# Patient Record
Sex: Male | Born: 1960 | Race: White | Hispanic: No | Marital: Single | Smoking: Never smoker
Health system: Southern US, Community
[De-identification: ages and names within clinical notes are randomized; demographics above are authoritative.]

## PROBLEM LIST (undated history)

## (undated) DIAGNOSIS — E559 Vitamin D deficiency, unspecified: Secondary | ICD-10-CM

## (undated) DIAGNOSIS — E109 Type 1 diabetes mellitus without complications: Secondary | ICD-10-CM

## (undated) DIAGNOSIS — R569 Unspecified convulsions: Secondary | ICD-10-CM

## (undated) DIAGNOSIS — E039 Hypothyroidism, unspecified: Secondary | ICD-10-CM

## (undated) DIAGNOSIS — S32009A Unspecified fracture of unspecified lumbar vertebra, initial encounter for closed fracture: Secondary | ICD-10-CM

## (undated) DIAGNOSIS — A3211 Listerial meningitis: Secondary | ICD-10-CM

## (undated) HISTORY — PX: NO PAST SURGERIES: SHX2092

---

## 2010-04-18 ENCOUNTER — Inpatient Hospital Stay: Payer: Self-pay | Admitting: Endocrinology

## 2010-10-24 ENCOUNTER — Inpatient Hospital Stay: Payer: Self-pay | Admitting: Endocrinology

## 2014-09-13 DIAGNOSIS — E1065 Type 1 diabetes mellitus with hyperglycemia: Secondary | ICD-10-CM | POA: Insufficient documentation

## 2014-09-13 DIAGNOSIS — E538 Deficiency of other specified B group vitamins: Secondary | ICD-10-CM | POA: Insufficient documentation

## 2017-06-28 ENCOUNTER — Emergency Department: Payer: 59

## 2017-06-28 ENCOUNTER — Inpatient Hospital Stay
Admission: EM | Admit: 2017-06-28 | Discharge: 2017-07-04 | DRG: 543 | Disposition: A | Discharge status: Skilled Nursing Facility | Payer: 59 | Attending: Internal Medicine | Admitting: Internal Medicine

## 2017-06-28 ENCOUNTER — Encounter: Payer: Self-pay | Admitting: Emergency Medicine

## 2017-06-28 DIAGNOSIS — S32040A Wedge compression fracture of fourth lumbar vertebra, initial encounter for closed fracture: Secondary | ICD-10-CM

## 2017-06-28 DIAGNOSIS — S32049A Unspecified fracture of fourth lumbar vertebra, initial encounter for closed fracture: Secondary | ICD-10-CM

## 2017-06-28 DIAGNOSIS — Z794 Long term (current) use of insulin: Secondary | ICD-10-CM

## 2017-06-28 DIAGNOSIS — E10649 Type 1 diabetes mellitus with hypoglycemia without coma: Secondary | ICD-10-CM | POA: Diagnosis not present

## 2017-06-28 DIAGNOSIS — M810 Age-related osteoporosis without current pathological fracture: Secondary | ICD-10-CM | POA: Diagnosis present

## 2017-06-28 DIAGNOSIS — M4856XA Collapsed vertebra, not elsewhere classified, lumbar region, initial encounter for fracture: Secondary | ICD-10-CM | POA: Diagnosis not present

## 2017-06-28 DIAGNOSIS — R55 Syncope and collapse: Secondary | ICD-10-CM

## 2017-06-28 DIAGNOSIS — R001 Bradycardia, unspecified: Secondary | ICD-10-CM | POA: Diagnosis present

## 2017-06-28 DIAGNOSIS — M545 Low back pain: Secondary | ICD-10-CM | POA: Diagnosis present

## 2017-06-28 DIAGNOSIS — E109 Type 1 diabetes mellitus without complications: Secondary | ICD-10-CM

## 2017-06-28 DIAGNOSIS — R569 Unspecified convulsions: Secondary | ICD-10-CM | POA: Diagnosis present

## 2017-06-28 DIAGNOSIS — S32041A Stable burst fracture of fourth lumbar vertebra, initial encounter for closed fracture: Secondary | ICD-10-CM | POA: Diagnosis present

## 2017-06-28 DIAGNOSIS — E039 Hypothyroidism, unspecified: Secondary | ICD-10-CM | POA: Diagnosis present

## 2017-06-28 DIAGNOSIS — E1065 Type 1 diabetes mellitus with hyperglycemia: Secondary | ICD-10-CM | POA: Diagnosis present

## 2017-06-28 DIAGNOSIS — R471 Dysarthria and anarthria: Secondary | ICD-10-CM | POA: Diagnosis present

## 2017-06-28 DIAGNOSIS — Y92410 Unspecified street and highway as the place of occurrence of the external cause: Secondary | ICD-10-CM

## 2017-06-28 DIAGNOSIS — K567 Ileus, unspecified: Secondary | ICD-10-CM

## 2017-06-28 DIAGNOSIS — R000 Tachycardia, unspecified: Secondary | ICD-10-CM | POA: Diagnosis present

## 2017-06-28 DIAGNOSIS — R739 Hyperglycemia, unspecified: Secondary | ICD-10-CM

## 2017-06-28 HISTORY — DX: Hypothyroidism, unspecified: E03.9

## 2017-06-28 HISTORY — DX: Type 1 diabetes mellitus without complications: E10.9

## 2017-06-28 LAB — TROPONIN I
Troponin I: <0.03 ng/mL (ref <0.03)
Troponin I: <0.03 ng/mL (ref <0.03)

## 2017-06-28 LAB — COMPREHENSIVE METABOLIC PANEL
ALK PHOS: 141 U/L — AB (ref 38–126)
ALT: 34 U/L (ref 17–63)
ANION GAP: 15 (ref 5–15)
AST: 49 U/L — ABNORMAL HIGH (ref 15–41)
Albumin: 3.4 g/dL — ABNORMAL LOW (ref 3.5–5.0)
BUN: 12 mg/dL (ref 6–20)
CALCIUM: 8.6 mg/dL — AB (ref 8.9–10.3)
CHLORIDE: 103 mmol/L (ref 101–111)
CO2: 18 mmol/L — AB (ref 22–32)
CREATININE: 0.81 mg/dL (ref 0.61–1.24)
Glucose, Bld: 441 mg/dL — ABNORMAL HIGH (ref 65–99)
Potassium: 4 mmol/L (ref 3.5–5.1)
SODIUM: 136 mmol/L (ref 135–145)
Total Bilirubin: 0.3 mg/dL (ref 0.3–1.2)
Total Protein: 7 g/dL (ref 6.5–8.1)

## 2017-06-28 LAB — URINALYSIS, ROUTINE W REFLEX MICROSCOPIC
Bacteria, UA: NONE SEEN
Bilirubin Urine: NEGATIVE
Glucose, UA: >=500 mg/dL — AB
Hgb urine dipstick: NEGATIVE
Ketones, ur: 5 mg/dL — AB
Leukocytes, UA: NEGATIVE
Nitrite: NEGATIVE
PROTEIN: NEGATIVE mg/dL
SPECIFIC GRAVITY, URINE: 1.038 — AB (ref 1.005–1.030)
SQUAMOUS EPITHELIAL / LPF: NONE SEEN
pH: 6 (ref 5.0–8.0)

## 2017-06-28 LAB — GLUCOSE, CAPILLARY
GLUCOSE-CAPILLARY: 390 mg/dL — AB (ref 65–99)
GLUCOSE-CAPILLARY: 403 mg/dL — AB (ref 65–99)
GLUCOSE-CAPILLARY: 138 mg/dL — AB (ref 65–99)

## 2017-06-28 LAB — CBC WITH DIFFERENTIAL/PLATELET
Basophils Absolute: 0.1 10*3/uL (ref 0–0.1)
Basophils Relative: 1 %
EOS ABS: 0.1 10*3/uL (ref 0–0.7)
EOS PCT: 1 %
HCT: 35.6 % — ABNORMAL LOW (ref 40.0–52.0)
Hemoglobin: 11.3 g/dL — ABNORMAL LOW (ref 13.0–18.0)
LYMPHS ABS: 2.4 10*3/uL (ref 1.0–3.6)
LYMPHS PCT: 20 %
MCH: 24.7 pg — AB (ref 26.0–34.0)
MCHC: 31.7 g/dL — AB (ref 32.0–36.0)
MCV: 77.9 fL — AB (ref 80.0–100.0)
MONO ABS: 0.7 10*3/uL (ref 0.2–1.0)
MONOS PCT: 6 %
Neutro Abs: 9.1 10*3/uL — ABNORMAL HIGH (ref 1.4–6.5)
Neutrophils Relative %: 72 %
PLATELETS: 415 10*3/uL (ref 150–440)
RBC: 4.56 MIL/uL (ref 4.40–5.90)
RDW: 18.6 % — ABNORMAL HIGH (ref 11.5–14.5)
WBC: 12.5 10*3/uL — ABNORMAL HIGH (ref 3.8–10.6)

## 2017-06-28 LAB — URINE DRUG SCREEN, QUALITATIVE (ARMC ONLY)
Amphetamines, Ur Screen: NOT DETECTED
BARBITURATES, UR SCREEN: NOT DETECTED
Benzodiazepine, Ur Scrn: NOT DETECTED
CANNABINOID 50 NG, UR ~~LOC~~: NOT DETECTED
COCAINE METABOLITE, UR ~~LOC~~: NOT DETECTED
MDMA (Ecstasy)Ur Screen: NOT DETECTED
Methadone Scn, Ur: NOT DETECTED
Opiate, Ur Screen: POSITIVE — AB
Phencyclidine (PCP) Ur S: NOT DETECTED
TRICYCLIC, UR SCREEN: NOT DETECTED

## 2017-06-28 LAB — BASIC METABOLIC PANEL
ANION GAP: 8 (ref 5–15)
BUN: 10 mg/dL (ref 6–20)
CALCIUM: 8.1 mg/dL — AB (ref 8.9–10.3)
CO2: 25 mmol/L (ref 22–32)
Chloride: 106 mmol/L (ref 101–111)
Creatinine, Ser: 0.71 mg/dL (ref 0.61–1.24)
GLUCOSE: 265 mg/dL — AB (ref 65–99)
Potassium: 3.8 mmol/L (ref 3.5–5.1)
Sodium: 139 mmol/L (ref 135–145)

## 2017-06-28 LAB — PROTIME-INR
INR: 0.97
Prothrombin Time: 12.9 seconds (ref 11.4–15.2)

## 2017-06-28 LAB — TYPE AND SCREEN
ABO/RH(D): O POS
Antibody Screen: NEGATIVE

## 2017-06-28 LAB — ETHANOL: Alcohol, Ethyl (B): <5 mg/dL (ref <5)

## 2017-06-28 LAB — APTT: aPTT: 27 seconds (ref 24–36)

## 2017-06-28 LAB — LIPASE, BLOOD: LIPASE: 26 U/L (ref 11–51)

## 2017-06-28 MED ORDER — KETOROLAC TROMETHAMINE 15 MG/ML IJ SOLN
15.0000 mg | Freq: Four times a day (QID) | INTRAMUSCULAR | Status: AC | PRN
  Administered 2017-06-28 – 2017-07-03 (×15): 15 mg via INTRAVENOUS
  Filled 2017-06-28 (×15): qty 1

## 2017-06-28 MED ORDER — ONDANSETRON HCL 4 MG/2ML IJ SOLN
4.0000 mg | Freq: Once | INTRAMUSCULAR | Status: AC
  Administered 2017-06-28: 4 mg via INTRAVENOUS
  Filled 2017-06-28: qty 2

## 2017-06-28 MED ORDER — MORPHINE SULFATE (PF) 2 MG/ML IV SOLN
2.0000 mg | Freq: Once | INTRAVENOUS | Status: AC
  Administered 2017-06-28: 2 mg via INTRAVENOUS
  Filled 2017-06-28: qty 1

## 2017-06-28 MED ORDER — SODIUM CHLORIDE 0.9 % IV SOLN
INTRAVENOUS | Status: AC
  Administered 2017-06-28 – 2017-06-29 (×2): via INTRAVENOUS

## 2017-06-28 MED ORDER — TRAMADOL HCL 50 MG PO TABS
50.0000 mg | ORAL_TABLET | Freq: Four times a day (QID) | ORAL | Status: DC | PRN
Start: 2017-06-28 — End: 2017-07-02
  Administered 2017-06-28 – 2017-07-01 (×8): 50 mg via ORAL
  Filled 2017-06-28 (×8): qty 1

## 2017-06-28 MED ORDER — STROKE: EARLY STAGES OF RECOVERY BOOK: Freq: Once | Status: DC

## 2017-06-28 MED ORDER — ENOXAPARIN SODIUM 40 MG/0.4ML ~~LOC~~ SOLN: 40.0000 mg | SUBCUTANEOUS | Status: DC

## 2017-06-28 MED ORDER — INSULIN ASPART 100 UNIT/ML ~~LOC~~ SOLN
0.0000 [IU] | Freq: Every day | SUBCUTANEOUS | Status: DC
  Administered 2017-06-29 – 2017-07-03 (×4): 2 [IU] via SUBCUTANEOUS
  Filled 2017-06-28 (×4): qty 1

## 2017-06-28 MED ORDER — ACETAMINOPHEN 325 MG PO TABS: 650.0000 mg | ORAL_TABLET | Freq: Four times a day (QID) | ORAL | Status: DC | PRN

## 2017-06-28 MED ORDER — ACETAMINOPHEN 650 MG RE SUPP: 650.0000 mg | Freq: Four times a day (QID) | RECTAL | Status: DC | PRN

## 2017-06-28 MED ORDER — INSULIN ASPART 100 UNIT/ML ~~LOC~~ SOLN
10.0000 [IU] | Freq: Once | SUBCUTANEOUS | Status: AC
  Administered 2017-06-28: 10 [IU] via SUBCUTANEOUS
  Filled 2017-06-28: qty 1

## 2017-06-28 MED ORDER — INSULIN ASPART 100 UNIT/ML ~~LOC~~ SOLN
0.0000 [IU] | Freq: Three times a day (TID) | SUBCUTANEOUS | Status: DC
  Administered 2017-06-29 (×2): 5 [IU] via SUBCUTANEOUS
  Administered 2017-06-29 – 2017-07-01 (×2): 2 [IU] via SUBCUTANEOUS
  Administered 2017-07-01: 1 [IU] via SUBCUTANEOUS
  Administered 2017-07-02: 3 [IU] via SUBCUTANEOUS
  Administered 2017-07-02 (×2): 2 [IU] via SUBCUTANEOUS
  Administered 2017-07-03 (×2): 3 [IU] via SUBCUTANEOUS
  Administered 2017-07-03: 2 [IU] via SUBCUTANEOUS
  Administered 2017-07-04: 3 [IU] via SUBCUTANEOUS
  Filled 2017-06-28 (×12): qty 1

## 2017-06-28 MED ORDER — IOPAMIDOL (ISOVUE-370) INJECTION 76%
100.0000 mL | Freq: Once | INTRAVENOUS | Status: AC | PRN
  Administered 2017-06-28: 100 mL via INTRAVENOUS

## 2017-06-28 MED ORDER — ENOXAPARIN SODIUM 30 MG/0.3ML ~~LOC~~ SOLN
30.0000 mg | SUBCUTANEOUS | Status: DC
Start: 2017-06-29 — End: 2017-06-29
  Administered 2017-06-29: 30 mg via SUBCUTANEOUS
  Filled 2017-06-28: qty 0.3

## 2017-06-28 MED ORDER — HYDROMORPHONE HCL 1 MG/ML IJ SOLN
0.5000 mg | Freq: Once | INTRAMUSCULAR | Status: AC
  Administered 2017-06-28: 0.5 mg via INTRAVENOUS
  Filled 2017-06-28: qty 1

## 2017-06-28 MED ORDER — SODIUM CHLORIDE 0.9 % IV BOLUS (SEPSIS)
1000.0000 mL | Freq: Once | INTRAVENOUS | Status: AC
  Administered 2017-06-28: 1000 mL via INTRAVENOUS

## 2017-06-28 MED ORDER — ASPIRIN EC 81 MG PO TBEC
81.0000 mg | DELAYED_RELEASE_TABLET | Freq: Every day | ORAL | Status: DC
  Administered 2017-06-28: 81 mg via ORAL
  Filled 2017-06-28 (×2): qty 1

## 2017-06-28 MED ORDER — INSULIN ASPART 100 UNIT/ML ~~LOC~~ SOLN
5.0000 [IU] | Freq: Once | SUBCUTANEOUS | Status: AC
  Administered 2017-06-28: 5 [IU] via INTRAVENOUS
  Filled 2017-06-28: qty 1

## 2017-06-28 MED ORDER — MORPHINE SULFATE (PF) 4 MG/ML IV SOLN
4.0000 mg | Freq: Once | INTRAVENOUS | Status: AC
  Administered 2017-06-28: 4 mg via INTRAVENOUS
  Filled 2017-06-28: qty 1

## 2017-06-28 NOTE — Progress Notes (Signed)
ADMISSION NOTE:  Pt admitted to room 150 from ED. Pt alert and oriented, Skin assessment completed, sacral foam and tele monitor applied. No personal belongings at bedside. Pt oriented to room and call bell. Family at bedside. Bed in lowest position call bell in reach and bed alarm on.

## 2017-06-28 NOTE — ED Notes (Signed)
Discussed pts amount slurred speech with dr Clearnce Hasten. Called CT for head scan and will do angio once labs back.

## 2017-06-28 NOTE — ED Notes (Signed)
Pt remains to c/o pain to back. Dr Clearnce Hasten notified.

## 2017-06-28 NOTE — Progress Notes (Signed)
Pts BG 403, MD Gouru notified orders received to give one time dose Novolog 10 units.
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
## 2017-06-28 NOTE — ED Notes (Signed)
Pt now staying at [facility], will not be transferred.  Family updated by  Dr Diannia Ruder
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
## 2017-06-28 NOTE — ED Notes (Signed)
Patient transported to CT 

## 2017-06-28 NOTE — ED Notes (Signed)
c collar removed by dr Stark Klein
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
## 2017-06-28 NOTE — ED Notes (Signed)
Returned from CT, neuro consult on screen for exam
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
## 2017-06-28 NOTE — Progress Notes (Signed)
Lumbar corset delivered and applied on pt.
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
## 2017-06-29 ENCOUNTER — Observation Stay: Payer: 59

## 2017-06-29 ENCOUNTER — Observation Stay (HOSPITAL_BASED_OUTPATIENT_CLINIC_OR_DEPARTMENT_OTHER)
Admit: 2017-06-29 | Discharge: 2017-06-29 | Disposition: A | Discharge status: Home / Self Care | Payer: 59 | Attending: Internal Medicine | Admitting: Internal Medicine

## 2017-06-29 DIAGNOSIS — R55 Syncope and collapse: Secondary | ICD-10-CM

## 2017-06-29 LAB — CBC
HCT: 29.6 % — ABNORMAL LOW (ref 40.0–52.0)
Hemoglobin: 9.6 g/dL — ABNORMAL LOW (ref 13.0–18.0)
MCH: 24.9 pg — ABNORMAL LOW (ref 26.0–34.0)
MCHC: 32.5 g/dL (ref 32.0–36.0)
MCV: 76.7 fL — AB (ref 80.0–100.0)
PLATELETS: 334 10*3/uL (ref 150–440)
RBC: 3.86 MIL/uL — AB (ref 4.40–5.90)
RDW: 18.1 % — AB (ref 11.5–14.5)
WBC: 12 10*3/uL — AB (ref 3.8–10.6)

## 2017-06-29 LAB — TROPONIN I: Troponin I: <0.03 ng/mL (ref <0.03)

## 2017-06-29 LAB — COMPREHENSIVE METABOLIC PANEL
ALT: 24 U/L (ref 17–63)
AST: 26 U/L (ref 15–41)
Albumin: 2.8 g/dL — ABNORMAL LOW (ref 3.5–5.0)
Alkaline Phosphatase: 98 U/L (ref 38–126)
Anion gap: 7 (ref 5–15)
BILIRUBIN TOTAL: 0.5 mg/dL (ref 0.3–1.2)
BUN: 10 mg/dL (ref 6–20)
CALCIUM: 7.7 mg/dL — AB (ref 8.9–10.3)
CHLORIDE: 106 mmol/L (ref 101–111)
CO2: 23 mmol/L (ref 22–32)
CREATININE: 0.57 mg/dL — AB (ref 0.61–1.24)
Glucose, Bld: 216 mg/dL — ABNORMAL HIGH (ref 65–99)
Potassium: 3.6 mmol/L (ref 3.5–5.1)
Sodium: 136 mmol/L (ref 135–145)
TOTAL PROTEIN: 5.6 g/dL — AB (ref 6.5–8.1)

## 2017-06-29 LAB — LIPID PANEL
CHOLESTEROL: 125 mg/dL (ref 0–200)
HDL: 32 mg/dL — AB (ref >40)
LDL Cholesterol: 71 mg/dL (ref 0–99)
TRIGLYCERIDES: 109 mg/dL (ref <150)
Total CHOL/HDL Ratio: 3.9 RATIO
VLDL: 22 mg/dL (ref 0–40)

## 2017-06-29 LAB — GLUCOSE, CAPILLARY
GLUCOSE-CAPILLARY: 290 mg/dL — AB (ref 65–99)
GLUCOSE-CAPILLARY: 220 mg/dL — AB (ref 65–99)
Glucose-Capillary: 281 mg/dL — ABNORMAL HIGH (ref 65–99)
Glucose-Capillary: 216 mg/dL — ABNORMAL HIGH (ref 65–99)
Glucose-Capillary: 180 mg/dL — ABNORMAL HIGH (ref 65–99)
Glucose-Capillary: 177 mg/dL — ABNORMAL HIGH (ref 65–99)

## 2017-06-29 LAB — ECHOCARDIOGRAM COMPLETE
Height: 68 in
WEIGHTICAEL: 2080 [oz_av]

## 2017-06-29 LAB — HIV ANTIBODY (ROUTINE TESTING W REFLEX): HIV Screen 4th Generation wRfx: NONREACTIVE

## 2017-06-29 LAB — TSH: TSH: 3.037 u[IU]/mL (ref 0.350–4.500)

## 2017-06-29 MED ORDER — LEVOTHYROXINE SODIUM 25 MCG PO TABS
125.0000 ug | ORAL_TABLET | Freq: Every day | ORAL | Status: DC
  Administered 2017-07-01 – 2017-07-04 (×4): 125 ug via ORAL
  Filled 2017-06-29 (×4): qty 1

## 2017-06-29 MED ORDER — SODIUM CHLORIDE 0.9 % IV SOLN
INTRAVENOUS | Status: DC
  Administered 2017-06-29: 13:00:00 via INTRAVENOUS
  Administered 2017-06-30: 100 mL/h via INTRAVENOUS
  Administered 2017-06-30 – 2017-07-03 (×6): via INTRAVENOUS

## 2017-06-29 MED ORDER — MORPHINE SULFATE (PF) 2 MG/ML IV SOLN
2.0000 mg | INTRAVENOUS | Status: DC | PRN
  Administered 2017-06-29 – 2017-07-01 (×4): 2 mg via INTRAVENOUS
  Filled 2017-06-29 (×4): qty 1

## 2017-06-29 MED ORDER — LIDOCAINE 5 % EX PTCH
1.0000 | MEDICATED_PATCH | CUTANEOUS | Status: DC
  Administered 2017-06-29 – 2017-07-04 (×6): 1 via TRANSDERMAL
  Filled 2017-06-29 (×6): qty 1

## 2017-06-29 MED ORDER — SODIUM CHLORIDE 0.9 % IV BOLUS (SEPSIS)
250.0000 mL | Freq: Once | INTRAVENOUS | Status: AC
  Administered 2017-06-29: 250 mL via INTRAVENOUS

## 2017-06-29 MED ORDER — INSULIN DETEMIR 100 UNIT/ML ~~LOC~~ SOLN
12.0000 [IU] | Freq: Two times a day (BID) | SUBCUTANEOUS | Status: DC
  Administered 2017-06-29 (×2): 12 [IU] via SUBCUTANEOUS
  Filled 2017-06-29 (×4): qty 0.12

## 2017-06-29 MED ORDER — ONDANSETRON HCL 4 MG/2ML IJ SOLN
4.0000 mg | Freq: Four times a day (QID) | INTRAMUSCULAR | Status: DC | PRN
  Administered 2017-06-29 – 2017-07-03 (×3): 4 mg via INTRAVENOUS
  Filled 2017-06-29 (×3): qty 2
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
## 2017-06-29 NOTE — Progress Notes (Signed)
Notified Dr Tressia Miners that upon working with PT, patients HR dropped to 39 on tele monitor. Patient became diaphoretic, dizzy and confused. Per Dr Tressia Miners, will begin NS at 163ml/hr and continue to monitor.

## 2017-06-29 NOTE — Progress Notes (Signed)
Pt complaining of nausea. Dr. Jannifer Franklin notified. MD to place order.

## 2017-06-29 NOTE — Progress Notes (Signed)
SLP Cancellation Note  Patient Details Name: Carlos Mueller MRN: [id number] DOB: 05/13/1961   Cancelled treatment:       Reason Eval/Treat Not Completed: SLP screened, no needs identified, will sign off (chart reviewed; MD/NSG consulted) MD indicated pt is at his baseline w/ cognitive-linguistic issues; NSG has only noted min confusion intermittently when pt's blood pressure lowers, orthostatic; otherwise wfl. Pt verbally communicates w/ NSG/MD appropriately per MD. Noted head CT negative.  ST services will sign off at this time per MD; MD to reconsult if change in status.    Orinda Kenner, MS, CCC-SLP Watson,Katherine 06/29/2017, 3:42 PM

## 2017-06-29 NOTE — NC FL2 (Signed)
[place] LEVEL OF CARE SCREENING TOOL     IDENTIFICATION  Patient Name: Carlos Mueller Birthdate: 10-21-1961 Sex: male Admission Date (Current Location): 06/28/2017  [place] and [place] Number:  Engineering geologist and Address:  [facility], [address], [place], [place] [address]      Provider Number: [id number]  Attending Physician Name and Address:  Gladstone Lighter, MD  Relative Name and Phone Number:       Current Level of Care: Hospital Recommended Level of Care: [place] Prior Approval Number:    Date Approved/Denied: 06/29/17 PASRR Number: [id number]  Discharge Plan: SNF    Current Diagnoses: Patient Active Problem List   Diagnosis Date Noted  . Syncope 06/28/2017    Orientation RESPIRATION BLADDER Height & Weight     Self, Time, Situation, Place  Normal Continent Weight: 130 lb (59 kg) Height:  5\' 8"  (172.7 cm)  BEHAVIORAL SYMPTOMS/MOOD NEUROLOGICAL BOWEL NUTRITION STATUS      Continent Diet (Carb modified )  AMBULATORY STATUS COMMUNICATION OF NEEDS Skin   Extensive Assist Verbally Normal                       Personal Care Assistance Level of Assistance  Bathing, Feeding, Dressing Bathing Assistance: Limited assistance Feeding assistance: Independent Dressing Assistance: Limited assistance     Functional Limitations Info             SPECIAL CARE FACTORS FREQUENCY  PT (By licensed PT)     PT Frequency: Up to 5X per day, 5 days per week              Contractures Contractures Info: Not present    Additional Factors Info  Code Status, Allergies, Insulin Sliding Scale Code Status Info: Full Allergies Info: NKA   Insulin Sliding Scale Info: Levemir: Inject 0.12 mLs (12 Units total) into the skin 2 (two) times daily. - Subcutaneous       Current Medications (06/29/2017):  This is the current hospital active medication list Current Facility-Administered Medications   Medication Dose Route Frequency Provider Last Rate Last Dose  .  stroke: mapping our early stages of recovery book   Does not apply Once Gouru, Aruna, MD      . 0.9 %  sodium chloride infusion   Intravenous Continuous Gladstone Lighter, MD 100 mL/hr at 06/29/17 1240    . acetaminophen (TYLENOL) tablet 650 mg  650 mg Oral Q6H PRN Gouru, Aruna, MD       Or  . acetaminophen (TYLENOL) suppository 650 mg  650 mg Rectal Q6H PRN Gouru, Aruna, MD      . insulin aspart (novoLOG) injection 0-5 Units  0-5 Units Subcutaneous QHS Gouru, Aruna, MD      . insulin aspart (novoLOG) injection 0-9 Units  0-9 Units Subcutaneous TID WC Gouru, Aruna, MD   2 Units at 06/29/17 1200  . insulin detemir (LEVEMIR) injection 12 Units  12 Units Subcutaneous BID Gladstone Lighter, MD   12 Units at 06/29/17 1527  . ketorolac (TORADOL) 15 MG/ML injection 15 mg  15 mg Intravenous Q6H PRN Gouru, Aruna, MD   15 mg at 06/29/17 0305  . [START ON 06/30/2017] levothyroxine (SYNTHROID, LEVOTHROID) tablet 125 mcg  125 mcg Oral QAC breakfast Gladstone Lighter, MD      . lidocaine (LIDODERM) 5 % 1 patch  1 patch Transdermal Q24H Gladstone Lighter, MD   1 patch at 06/29/17 1113  . morphine 2  MG/ML injection 2 mg  2 mg Intravenous Q4H PRN Gladstone Lighter, MD   2 mg at 06/29/17 1112  . ondansetron (ZOFRAN) injection 4 mg  4 mg Intravenous Q6H PRN Lance Coon, MD   4 mg at 06/29/17 0226  . traMADol (ULTRAM) tablet 50 mg  50 mg Oral Q6H PRN Gouru, Aruna, MD   50 mg at 06/29/17 1527     Discharge Medications: Please see discharge summary for a list of discharge medications.  Relevant Imaging Results:  Relevant Lab Results:   Additional Information SS# [id number]  Zettie Pho, LCSW
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
## 2017-06-29 NOTE — Progress Notes (Signed)
Assessed patient during med pass. Patient confused and speech slurred with complaint of dizziness and headache. Blood sugar 290, BP 108/46, Pulse 72, RR 12, Temp 99.5. Notified Dr Tressia Miners. MD ordered one time bolus 272mL .09NACL and recommended giving pain meds to decrease headache. Started bolus at 1755 and administered 15mg  of Toradol. Will continue to monitor.

## 2017-06-29 NOTE — Progress Notes (Signed)
Pt alert and oriented. Able to move upper and lower limbs. Pt medicated for pain through the night. Pt able to sleep some in between care. Pt was able to move bowels and is voiding in the urinal without difficulty.

## 2017-06-29 NOTE — Progress Notes (Signed)
*  PRELIMINARY RESULTS* Echocardiogram 2D Echocardiogram has been performed.  Carlos Mueller 06/29/2017, 11:58 AM

## 2017-06-29 NOTE — Progress Notes (Signed)
Subjective:  Still in significant pain.  No nausea       Patient reports pain as severe.  Objective: Alert, lying supine.  Has LS orthosis in place.  CSM good distally.   VITALS:   Vitals:   06/29/17 1000 06/29/17 1105  BP: (!) 98/54 (!) 109/59  Pulse: (!) 58 69  Resp:    Temp:    SpO2:      Neurologically intact ABD soft Neurovascular intact Sensation intact distally Intact pulses distally Dorsiflexion/Plantar flexion intact  LABS  Recent Labs  06/28/17 0939 06/29/17 0344  HGB 11.3* 9.6*  HCT 35.6* 29.6*  WBC 12.5* 12.0*  PLT 415 334     Recent Labs  06/28/17 0939 06/28/17 1200 06/29/17 0344  NA 136 139 136  K 4.0 3.8 3.6  BUN 12 10 10   CREATININE 0.81 0.71 0.57*  GLUCOSE 441* 265* 216*     Recent Labs  06/28/17 0947  INR 0.97     Assessment/Plan:      Advance diet Up with therapy Discharge to SNF if necessary.  RTC 2 weeks after discharge

## 2017-06-30 ENCOUNTER — Observation Stay: Payer: 59

## 2017-06-30 ENCOUNTER — Encounter: Payer: Self-pay | Admitting: Physician Assistant

## 2017-06-30 DIAGNOSIS — E109 Type 1 diabetes mellitus without complications: Secondary | ICD-10-CM

## 2017-06-30 DIAGNOSIS — M4856XA Collapsed vertebra, not elsewhere classified, lumbar region, initial encounter for fracture: Secondary | ICD-10-CM | POA: Diagnosis present

## 2017-06-30 DIAGNOSIS — R000 Tachycardia, unspecified: Secondary | ICD-10-CM | POA: Diagnosis present

## 2017-06-30 DIAGNOSIS — M810 Age-related osteoporosis without current pathological fracture: Secondary | ICD-10-CM | POA: Diagnosis present

## 2017-06-30 DIAGNOSIS — R471 Dysarthria and anarthria: Secondary | ICD-10-CM | POA: Diagnosis present

## 2017-06-30 DIAGNOSIS — S32041A Stable burst fracture of fourth lumbar vertebra, initial encounter for closed fracture: Secondary | ICD-10-CM | POA: Diagnosis present

## 2017-06-30 DIAGNOSIS — Y92410 Unspecified street and highway as the place of occurrence of the external cause: Secondary | ICD-10-CM | POA: Diagnosis not present

## 2017-06-30 DIAGNOSIS — K567 Ileus, unspecified: Secondary | ICD-10-CM | POA: Diagnosis not present

## 2017-06-30 DIAGNOSIS — R001 Bradycardia, unspecified: Secondary | ICD-10-CM | POA: Diagnosis present

## 2017-06-30 DIAGNOSIS — R55 Syncope and collapse: Secondary | ICD-10-CM

## 2017-06-30 DIAGNOSIS — Z794 Long term (current) use of insulin: Secondary | ICD-10-CM | POA: Diagnosis not present

## 2017-06-30 DIAGNOSIS — E10649 Type 1 diabetes mellitus with hypoglycemia without coma: Secondary | ICD-10-CM | POA: Diagnosis not present

## 2017-06-30 DIAGNOSIS — E039 Hypothyroidism, unspecified: Secondary | ICD-10-CM | POA: Diagnosis present

## 2017-06-30 DIAGNOSIS — E1065 Type 1 diabetes mellitus with hyperglycemia: Secondary | ICD-10-CM | POA: Diagnosis present

## 2017-06-30 DIAGNOSIS — M545 Low back pain: Secondary | ICD-10-CM | POA: Diagnosis present

## 2017-06-30 LAB — GLUCOSE, CAPILLARY
GLUCOSE-CAPILLARY: 133 mg/dL — AB (ref 65–99)
GLUCOSE-CAPILLARY: 210 mg/dL — AB (ref 65–99)
Glucose-Capillary: 123 mg/dL — ABNORMAL HIGH (ref 65–99)
Glucose-Capillary: 63 mg/dL — ABNORMAL LOW (ref 65–99)
Glucose-Capillary: 67 mg/dL (ref 65–99)
Glucose-Capillary: 232 mg/dL — ABNORMAL HIGH (ref 65–99)
Glucose-Capillary: 245 mg/dL — ABNORMAL HIGH (ref 65–99)

## 2017-06-30 LAB — URINE CULTURE: Culture: NO GROWTH

## 2017-06-30 LAB — TROPONIN I: Troponin I: <0.03 ng/mL (ref <0.03)

## 2017-06-30 LAB — BASIC METABOLIC PANEL
ANION GAP: 4 — AB (ref 5–15)
BUN: 14 mg/dL (ref 6–20)
CALCIUM: 7.6 mg/dL — AB (ref 8.9–10.3)
CO2: 26 mmol/L (ref 22–32)
Chloride: 109 mmol/L (ref 101–111)
Creatinine, Ser: 0.75 mg/dL (ref 0.61–1.24)
GFR calc Af Amer: >60 mL/min (ref >60)
GLUCOSE: 101 mg/dL — AB (ref 65–99)
Potassium: 4 mmol/L (ref 3.5–5.1)
Sodium: 139 mmol/L (ref 135–145)

## 2017-06-30 LAB — HEMOGLOBIN A1C
Hgb A1c MFr Bld: 10.8 % — ABNORMAL HIGH (ref 4.8–5.6)
MEAN PLASMA GLUCOSE: 263.26 mg/dL

## 2017-06-30 LAB — CBC
HCT: 29 % — ABNORMAL LOW (ref 40.0–52.0)
Hemoglobin: 9.5 g/dL — ABNORMAL LOW (ref 13.0–18.0)
MCH: 25.3 pg — ABNORMAL LOW (ref 26.0–34.0)
MCHC: 32.6 g/dL (ref 32.0–36.0)
MCV: 77.6 fL — AB (ref 80.0–100.0)
PLATELETS: 332 10*3/uL (ref 150–440)
RBC: 3.74 MIL/uL — ABNORMAL LOW (ref 4.40–5.90)
RDW: 18.4 % — AB (ref 11.5–14.5)
WBC: 8.9 10*3/uL (ref 3.8–10.6)

## 2017-06-30 MED ORDER — METHOCARBAMOL 500 MG PO TABS
500.0000 mg | ORAL_TABLET | Freq: Three times a day (TID) | ORAL | Status: DC
  Administered 2017-06-30 (×2): 500 mg via ORAL
  Filled 2017-06-30 (×3): qty 1

## 2017-06-30 MED ORDER — DEXTROSE 50 % IV SOLN
25.0000 mL | Freq: Once | INTRAVENOUS | Status: AC
  Administered 2017-06-30: 25 mL via INTRAVENOUS

## 2017-06-30 MED ORDER — INSULIN DETEMIR 100 UNIT/ML ~~LOC~~ SOLN
7.0000 [IU] | Freq: Two times a day (BID) | SUBCUTANEOUS | Status: DC
  Administered 2017-06-30 – 2017-07-03 (×6): 7 [IU] via SUBCUTANEOUS
  Filled 2017-06-30 (×8): qty 0.07

## 2017-06-30 MED ORDER — DEXTROSE 50 % IV SOLN
INTRAVENOUS | Status: AC
  Administered 2017-06-30: 08:00:00
  Filled 2017-06-30: qty 50

## 2017-06-30 MED FILL — Medication: Qty: 1 | Status: AC
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
## 2017-06-30 NOTE — Progress Notes (Signed)
Patient arrived to 2A as a transfer from 1A.  Had been in xray and had a vasovagal response.  CODE BLUE was called but not initiated as patient spontaneously recovered.    Orderly brought patient up to floor.  Called to 1A to have the previous nurse come and give bedside report.  Upon arrival to 2A patient noted to be very lethargic and hard to arouse.  VSS, placed on telemetry and patient in NSR.  Dr. Tressia Miners on the floor and saw patient with nurse.  Patient in extreme amount of pain and screamed out when nursing tried to reposition him.    Family in waiting room brought to the room and answered their questions and had Dr. Tressia Miners speak to them as well.    Cardiology now following.

## 2017-06-30 NOTE — Progress Notes (Signed)
   06/30/17 1000  Clinical Encounter Type  Visited With Family;Patient not available  Visit Type Initial;Code  Referral From Nurse  Spiritual Encounters  Spiritual Needs Prayer;Emotional  [place] responded to a code blue for patient while in X-Ray; [place] met with sister in room and offered prayer and emotional support; [place] available to support family and patient as needed. 10:07 AM
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
## 2017-06-30 NOTE — Progress Notes (Signed)
OT Cancellation Note  Patient Details Name: Carlos Mueller MRN: [id number] DOB: February 09, 1961   Cancelled Treatment:    Reason Eval/Treat Not Completed: Pain limiting ability to participate. Pt in significant pain. Per MD note, bed rest ordered for today. Will continue to follow and re-attempt OT evaluation next date as medically appropriate.  Jeni Salles, MPH, MS, OTR/L ascom [phone] 06/30/17, 12:31 PM

## 2017-06-30 NOTE — Progress Notes (Signed)
PT Cancellation Note  Patient Details Name: Carlos Mueller MRN: [id number] DOB: [birth year]   Cancelled Treatment:    Reason Eval/Treat Not Completed: Other (comment);Patient not medically ready;Pain limiting ability to participate. Treatment attempted. Pt lethargic, but communicative and family in the room. Pt reports 9/10 low back pain. Family reports pt had an "episode" earlier at x ray. Code Blue called, but turned out to be a syncopal episode. Spoke with nursing and nursing notes discussion with MD and PT to be held today. Re attempt tomorrow.    Larae Grooms, PTA 06/30/2017, 12:41 PM

## 2017-06-30 NOTE — Clinical Social Work Placement (Addendum)
   CLINICAL SOCIAL WORK PLACEMENT  NOTE  Date:  06/30/2017  Patient Details  Name: Carlos Mueller MRN: [id number] Date of Birth: [birth year]  Clinical Social Work is seeking post-discharge placement for this patient at the [place] level of care (*CSW will initial, date and re-position this form in  chart as items are completed):  Yes   Patient/family provided with [place] Work Department's list of facilities offering this level of care within the geographic area requested by the patient (or if unable, by the patient's family).  Yes   Patient/family informed of their freedom to choose among providers that offer the needed level of care, that participate in Medicare, Medicaid or managed care program needed by the patient, have an available bed and are willing to accept the patient.  Yes   Patient/family informed of [facility]'s ownership interest in [facility] and [facility], as well as of the fact that they are under no obligation to receive care at these facilities.  PASRR submitted to EDS on 06/29/17     PASRR number received on 06/29/17     Existing PASRR number confirmed on       FL2 transmitted to all facilities in geographic area requested by pt/family on 06/29/17     FL2 transmitted to all facilities within larger geographic area on       Patient informed that his/her managed care company has contracts with or will negotiate with certain facilities, including the following:        Yes   Patient/family informed of bed offers received.  Patient chooses bed at  [facility] )     Physician recommends and patient chooses bed at      Patient to be transferred to  [facility] on   07-04-17.(Updated 07-04-17 Evette Cristal, MSW, [place])   Patient to be transferred to facility by  [facility]  (Updated 07-04-17 Evette Cristal, MSW, [place])   Patient family notified on  07-04-17 of transfer. (Updated 07-04-17 Evette Cristal, MSW, [place])    Name of family member notified:   Patient's sister Arville Go was at bedside  (Updated 07-04-17 Evette Cristal, MSW, [place])   PHYSICIAN       Additional Comment:    _______________________________________________ Sample, Veronia Beets, LCSW 06/30/2017, 5:26 PM   Jones Broom. [place], MSW, [place]  07/04/2017 11:51 AM Updated
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
## 2017-06-30 NOTE — Progress Notes (Signed)
Notified by NT that patient blood sugar 63. Notified Dr Tressia Miners. Ordered 25mg  D50. Administered D50. Glucose rechecked at 0826 up to 123.

## 2017-06-30 NOTE — Clinical Social Work Note (Signed)
Clinical Social Work Assessment  Patient Details  Name: Carlos Mueller MRN: [id number] Date of Birth: 1961-10-04  Date of referral:  06/30/17               Reason for consult:  Facility Placement                Permission sought to share information with:  Chartered certified accountant granted to share information::  Yes, Verbal Permission Granted  Name::      Carlos::   Mueller   Relationship::     Contact Information:     Housing/Transportation Living arrangements for the past 2 months:  Single Family Home Source of Information:  Patient, Other (Comment Required) (Sister Nature conservation officer ) Patient Interpreter Needed:  None Criminal Activity/Legal Involvement Pertinent to Current Situation/Hospitalization:  No - Comment as needed Significant Relationships:  Siblings Lives with:  Self Do you feel safe going back to the place where you live?  Yes Need for family participation in patient care:  Yes (Comment)  Care giving concerns:  Patient lives in [place] alone.    Social Worker assessment / plan:  Holiday representative (CSW) received SNF consult. PT is recommending SNF. CSW met with patient and his sister Carlos Mueller [phone] and brother in law Carlos Mueller [phone] were at bedside. CSW introduced self and explained role of CSW department. Patient was alert and oriented X4. Patient answered questions slowly. Per patient he lives alone and his sister, brother in law, and girlfriend check on him daily. CSW explained that PT is recommending SNF. Patient and his sister are agreeable to SNF search in [place]. FL2 complete and faxed out.   CSW presented bed offers to patient and his sister, they chose [facility]. [facility] admissions coordinator at [facility] is aware of accepted bed offer. CSW will continue to follow and assist as needed.    Employment status:  Disabled (Comment on whether or not currently receiving Disability) Insurance  information:  Managed Care PT Recommendations:  [place] / Referral to community resources:  [place]  Patient/Family's Response to care:  Patient and his sister are agreeable for patient to Mueller to [employer].   Patient/Family's Understanding of and Emotional Response to Diagnosis, Current Treatment, and Prognosis:  Patient and his sister were very pleasant and thanked CSW for assistance.   Emotional Assessment Appearance:  Appears stated age Attitude/Demeanor/Rapport:    Affect (typically observed):  Accepting, Adaptable, Pleasant Orientation:  Oriented to Self, Oriented to Place, Oriented to  Time, Oriented to Situation Alcohol / Substance use:  Not Applicable Psych involvement (Current and /or in the community):  No (Comment)  Discharge Needs  Concerns to be addressed:  Discharge Planning Concerns Readmission within the last 30 days:  No Current discharge risk:  Dependent with Mobility Barriers to Discharge:  Continued Medical Work up   [employer], Veronia Beets, LCSW 06/30/2017, 5:26 PM
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
## 2017-07-01 ENCOUNTER — Inpatient Hospital Stay: Payer: 59

## 2017-07-01 ENCOUNTER — Encounter: Payer: Self-pay | Admitting: *Deleted

## 2017-07-01 LAB — GLUCOSE, CAPILLARY
Glucose-Capillary: 110 mg/dL — ABNORMAL HIGH (ref 65–99)
Glucose-Capillary: 154 mg/dL — ABNORMAL HIGH (ref 65–99)
Glucose-Capillary: 165 mg/dL — ABNORMAL HIGH (ref 65–99)
Glucose-Capillary: 134 mg/dL — ABNORMAL HIGH (ref 65–99)
Glucose-Capillary: 174 mg/dL — ABNORMAL HIGH (ref 65–99)

## 2017-07-01 MED ORDER — HYDROMORPHONE HCL 1 MG/ML IJ SOLN
2.0000 mg | INTRAMUSCULAR | Status: DC | PRN
  Administered 2017-07-01: 2 mg via INTRAVENOUS
  Filled 2017-07-01: qty 2

## 2017-07-01 MED ORDER — METHOCARBAMOL 500 MG PO TABS
500.0000 mg | ORAL_TABLET | Freq: Three times a day (TID) | ORAL | Status: AC
  Administered 2017-07-01 – 2017-07-02 (×6): 500 mg via ORAL
  Filled 2017-07-01 (×6): qty 1

## 2017-07-01 NOTE — Progress Notes (Signed)
PT Cancellation Note  Patient Details Name: Carlos Mueller MRN: [id number] DOB: Aug 17, 1961   Cancelled Treatment:    Reason Eval/Treat Not Completed: Other (comment). Spoke with nursing. Pt awaiting back brace that has not arrived at this time. Pt has also just aroused from sleeping due to pain medication and is still very lethargic. Nursing requests hold until tomorrow with use of back brace.    Larae Grooms, PTA 07/01/2017, 1:50 PM

## 2017-07-01 NOTE — Progress Notes (Signed)
OT Cancellation Note  Patient Details Name: Carlos Mueller MRN: [id number] DOB: 12-Jun-1961   Cancelled Treatment:    Reason Eval/Treat Not Completed: Medical issues which prohibited therapy. Continuing to follow acutely. Holding therapy at this time until new back brace has arrived and pending new x-rays and clearance from neurosurgery as to plan of care and patient cleared to work with therapy. Will re-attempt next date as medically appropriate.  Jeni Salles, MPH, MS, OTR/L ascom [phone] 07/01/17, 2:27 PM
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
## 2017-07-01 NOTE — Progress Notes (Signed)
OT Cancellation Note  Patient Details Name: Carlos Mueller MRN: [id number] DOB: 05/06/1961   Cancelled Treatment:    Reason Eval/Treat Not Completed: Medical issues which prohibited therapy. Per neurosurgery, pt requires new upright x-rays in brace to assess stability prior to clearing PT to work with pt, in addition to an MRI lumbar spine to assess nerve root impingement. Given hold on PT, will also hold OT evaluation at this time. Will continue to follow acutely and re-attempt OT evaluation at later date/time as pt is medically cleared for participation in therapy.  Jeni Salles, MPH, MS, OTR/L ascom [phone] 07/01/17, 8:21 AM
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
## 2017-07-02 LAB — GLUCOSE, CAPILLARY
GLUCOSE-CAPILLARY: 173 mg/dL — AB (ref 65–99)
GLUCOSE-CAPILLARY: 192 mg/dL — AB (ref 65–99)
Glucose-Capillary: 207 mg/dL — ABNORMAL HIGH (ref 65–99)
Glucose-Capillary: 213 mg/dL — ABNORMAL HIGH (ref 65–99)

## 2017-07-02 MED ORDER — SENNOSIDES-DOCUSATE SODIUM 8.6-50 MG PO TABS
1.0000 | ORAL_TABLET | Freq: Two times a day (BID) | ORAL | Status: DC
  Administered 2017-07-02 – 2017-07-04 (×5): 1 via ORAL
  Filled 2017-07-02 (×5): qty 1

## 2017-07-02 MED ORDER — POLYETHYLENE GLYCOL 3350 17 G PO PACK
17.0000 g | PACK | Freq: Every day | ORAL | Status: AC
  Administered 2017-07-02 – 2017-07-04 (×3): 17 g via ORAL
  Filled 2017-07-02 (×3): qty 1

## 2017-07-02 MED ORDER — HYDROCODONE-ACETAMINOPHEN 7.5-325 MG PO TABS
1.0000 | ORAL_TABLET | Freq: Four times a day (QID) | ORAL | Status: DC | PRN
  Administered 2017-07-02 – 2017-07-04 (×8): 1 via ORAL
  Filled 2017-07-02 (×8): qty 1
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
## 2017-07-02 NOTE — Progress Notes (Signed)
PT Cancellation Note  Patient Details Name: Carlos Mueller MRN: [id number] DOB: 06-Sep-1961   Cancelled Treatment:    Reason Eval/Treat Not Completed: Medical issues which prohibited therapy; Per neuro MD will need upright x-rays in brace to assess stability prior to clearing for physical therapy.  Will hold PT services until cleared medically to proceed.     Linus Salmons PT, DPT 07/02/17, 11:58 AM

## 2017-07-03 ENCOUNTER — Inpatient Hospital Stay: Payer: 59

## 2017-07-03 LAB — GLUCOSE, CAPILLARY
GLUCOSE-CAPILLARY: 220 mg/dL — AB (ref 65–99)
GLUCOSE-CAPILLARY: 232 mg/dL — AB (ref 65–99)
Glucose-Capillary: 231 mg/dL — ABNORMAL HIGH (ref 65–99)
Glucose-Capillary: 170 mg/dL — ABNORMAL HIGH (ref 65–99)

## 2017-07-03 LAB — BASIC METABOLIC PANEL
Anion gap: 6 (ref 5–15)
BUN: 12 mg/dL (ref 6–20)
CALCIUM: 7.5 mg/dL — AB (ref 8.9–10.3)
CO2: 27 mmol/L (ref 22–32)
Chloride: 106 mmol/L (ref 101–111)
Creatinine, Ser: 0.69 mg/dL (ref 0.61–1.24)
GFR calc Af Amer: >60 mL/min (ref >60)
GLUCOSE: 224 mg/dL — AB (ref 65–99)
Potassium: 4.1 mmol/L (ref 3.5–5.1)
SODIUM: 139 mmol/L (ref 135–145)

## 2017-07-03 MED ORDER — MORPHINE SULFATE (PF) 4 MG/ML IV SOLN
3.0000 mg | INTRAVENOUS | Status: AC
  Administered 2017-07-03: 3 mg via INTRAVENOUS
  Filled 2017-07-03: qty 1

## 2017-07-03 MED ORDER — ENOXAPARIN SODIUM 40 MG/0.4ML ~~LOC~~ SOLN
40.0000 mg | SUBCUTANEOUS | Status: DC
  Administered 2017-07-03: 40 mg via SUBCUTANEOUS
  Filled 2017-07-03: qty 0.4

## 2017-07-03 MED ORDER — BISACODYL 10 MG RE SUPP
10.0000 mg | Freq: Once | RECTAL | Status: AC
  Administered 2017-07-03: 10 mg via RECTAL
  Filled 2017-07-03: qty 1

## 2017-07-03 MED ORDER — INSULIN DETEMIR 100 UNIT/ML ~~LOC~~ SOLN
8.0000 [IU] | Freq: Two times a day (BID) | SUBCUTANEOUS | Status: DC
  Administered 2017-07-03 – 2017-07-04 (×2): 8 [IU] via SUBCUTANEOUS
  Filled 2017-07-03 (×3): qty 0.08

## 2017-07-03 NOTE — Progress Notes (Signed)
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 Hasn't had bowel movement for several days now. -Still has significant back pain  REVIEW OF SYSTEMS:  Review of Systems  Constitutional: Positive for malaise/fatigue. Negative for chills and fever.  HENT: Negative for ear discharge, hearing loss and nosebleeds.   Eyes: Negative for blurred vision and double vision.  Respiratory: Negative for cough, shortness of breath and wheezing.   Cardiovascular: Negative for chest pain, palpitations and leg swelling.  Gastrointestinal: Positive for constipation. Negative for abdominal pain, diarrhea, nausea and vomiting.  Genitourinary: Negative for dysuria.  Musculoskeletal: Positive for back pain and myalgias.  Neurological: Negative for dizziness, sensory change, speech change, focal weakness, seizures and headaches.  Psychiatric/Behavioral: Negative for depression.    DRUG ALLERGIES:  No Known Allergies  VITALS:  Blood pressure (!) 126/59, pulse 62, temperature 98.1 F (36.7 C), temperature source Oral, resp. rate 17, height 5\' 8"  (1.727 m), weight 59 kg (130 lb), SpO2 96 %.  PHYSICAL EXAMINATION:  Physical Exam  GENERAL:  56 y.o.-year-old patient lying in the bed,  in distress with even slight movement secondary to pain EYES: Pupils equal, round, reactive to light and accommodation. No scleral icterus. Extraocular muscles intact.  HEENT: Head atraumatic, normocephalic. Oropharynx and nasopharynx clear.  NECK:  Supple, no jugular venous distention. No thyroid enlargement, no tenderness.  LUNGS: Normal breath sounds bilaterally, no wheezing, rales,rhonchi or crepitation. No use of accessory muscles of respiration. Decreased bibasilar breath sounds CARDIOVASCULAR: S1,  S2 normal. No murmurs, rubs, or gallops.  ABDOMEN: Soft, nontender, nondistended. hypoactiveBowel sounds present. No organomegaly or mass.  Lumbar support in place EXTREMITIES: No pedal edema, cyanosis, or clubbing.  NEUROLOGIC: Cranial nerves II through XII are intact. Muscle strength 5/5 in all extremities. Sensation intact. Gait not checked.  PSYCHIATRIC: The patient is alert and oriented x 3 SKIN: No obvious rash, lesion, or ulcer.    LABORATORY PANEL:   CBC  Recent Labs Lab 06/30/17 0339  WBC 8.9  HGB 9.5*  HCT 29.0*  PLT 332   ------------------------------------------------------------------------------------------------------------------  Chemistries   Recent Labs Lab 06/29/17 0344  07/03/17 0557  NA 136  < > 139  K 3.6  < > 4.1  CL 106  < > 106  CO2 23  < > 27  GLUCOSE 216*  < > 224*  BUN 10  < > 12  CREATININE 0.57*  < > 0.69  CALCIUM 7.7*  < > 7.5*  AST 26  --   --   ALT 24  --   --   ALKPHOS 98  --   --   BILITOT 0.5  --   --   < > = values in this interval not displayed. ------------------------------------------------------------------------------------------------------------------  Cardiac Enzymes  Recent Labs Lab 06/30/17 2224  TROPONINI <0.03   ------------------------------------------------------------------------------------------------------------------  RADIOLOGY:  Dg Lumbar Spine 2-3 Views  Result Date: 07/03/2017 CLINICAL DATA:  Known L4 vertebral body fracture. EXAM: LUMBAR SPINE - 2-3 VIEW COMPARISON:  MRI of the lumbosacral spine 07/01/2017 FINDINGS: Upright images of the lumbosacral spine with the patient in brace demonstrate no significant change in the compression fracture of L4 vertebral body with approximately 40% height loss and mild retropulsion. There is straightening of the lumbosacral lordosis.  IMPRESSION: Stable appearance of L4 compression fracture with approximately 40% height loss and mild retropulsion to the spinal  canal. Electronically Signed   By: Fidela Salisbury M.D.   On: 07/03/2017 11:54   Dg Abd 1 View  Result Date: 07/03/2017 CLINICAL DATA:  Ileus EXAM: ABDOMEN - 1 VIEW COMPARISON:  CT 06/28/2017 FINDINGS: Moderate stool burden throughout the colon. Mild gaseous distention of large and small bowel, likely mild ileus. No organomegaly or free air. IMPRESSION: Mild gaseous distention of bowel, likely mild ileus. Moderate stool burden throughout the colon. Electronically Signed   By: Rolm Baptise M.D.   On: 07/03/2017 11:50    EKG:   Orders placed or performed during the hospital encounter of 06/28/17  . ED EKG  . ED EKG  . EKG 12-Lead  . EKG 12-Lead  . ED EKG  . ED EKG    ASSESSMENT AND PLAN:   56 year old male with past medical history significant for insulin-dependent diabetes mellitus brought to the hospital secondary to syncopal episode and also noted to have L4 vertebral fracture.  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 appreciate cardiology consult.no further cardiac workup needed -Echocardiogram normal. Carotid Dopplers without any hemodynamically significant stenosis. -troponins are negative. -CT of the head and C-spine negative for any acute findings on admission. Repeat CT head ordered negative as well  #2 L4 vertebral fracture-L4 vertebral body fracture, burst fracture with 40% height loss noted. - - not amenable for kyphoplasty due to the burst fracture. -Significant pain- meds adjusted,  Continue with thoraco Lumbosacral brace . Lidoderm patch as well. -Appreciate orthopedics consult and neurosurgery consult. - for MRI lumbar spine showing L4 burst fracture, no nerve root involvement. Has significant bilateral psoas edema -upright x-ray done today with stable L4 compression fracture. Checking with neurosurgery to see if patient is okay to work with physical  therapy  #3 hypothyroidism-continue Synthroid  #4 diabetes mellitus-A1c of 10.8. Levemir restarted at a lower dose due to type 1 DM. on sliding scale insulin No further hypoglycemic episodes  #5 DVT prophylaxis-on Lovenox  #6 constipation and ileus- mild ileus with significant stool burden noted.Started laxatives. Dulcolax suppository. -Started on full liquid diet today.  Physical therapy consult- will need SNF     All the records are reviewed and case discussed with Care Management/Social Workerr. Management plans discussed with the patient, family and they are in agreement.  CODE STATUS: Full Code   TOTAL   TIME spent in TAKING CARE OF THIS PATIENT: 36 minutes.   POSSIBLE D/C IN 2-3 DAYS, DEPENDING ON CLINICAL CONDITION.   Dezmond Downie M.D on 07/03/2017 at 12:11 PM  Between 7am to 6pm - Pager - [phone]  After 6pm go to [email or web address] - password [email or web address] [facility]  Office  [phone]  CC: Primary care physician; Patient, No Pcp Per

## 2017-07-03 NOTE — Progress Notes (Signed)
Met with patient, his sister and brother in law.  Patient confirms he always takes his Levemir 17 units bid, Novolog 12 units with breakfast, 6 units with lunch and 10 units with supper. He denies hypoglycemia.  Historically A1C have been elevated;  08/2016  11.1% 07/12/16   11.1% 03/14/16  10.3% 11/30/16   9.8%  He reports his blood sugars are elevated because he does not check blood sugar very often- this was noted in the MD chart and the endocrinology was unable to make insulin adjustments. Patient was last seen in 07/2016.  Gentry Fitz, RN, BA, MHA, CDE Diabetes Coordinator Inpatient Diabetes Program  [phone] (Team Pager) [phone] ([place]) 07/03/2017 1:26 PM

## 2017-07-03 NOTE — Clinical Social Work Note (Signed)
Patient plans to discharge to [facility] once he is medically ready for discharge and orders have been received.  CSW continuing to follow patient's progress throughout discharge planning.  Jones Broom. [place], MSW, [place]  07/03/2017 6:01 PM

## 2017-07-03 NOTE — Progress Notes (Signed)
PT Cancellation Note  Patient Details Name: JOTHAM AHN MRN: [id number] DOB: 05-30-1961   Cancelled Treatment:    Reason Eval/Treat Not Completed: Medical issues which prohibited therapy Spoke with nursing who reports we are still waiting on x-ray with brace to insure safety with activity/PT.  Will continue to follow.  Kreg Shropshire, DPT 07/03/2017, 11:15 AM

## 2017-07-03 NOTE — Progress Notes (Signed)
Checked on Carlos Mueller per request of Dr. Lacinda Axon. Patient was asleep and family was present at bedside. Patient awoke easily to voice but was very groggy. Strength 5/5 plantar flexion, dorsiflexion, and grip. 4-/5 illiopsoas, quads, biceps and triceps (all limited by back pain) Reviewed recent radiographs with family and explained findings. All questions were answered. Advised to follow up in two weeks at the office for progress evaluation and to obtain repeat x-rays.  Informed Dr. Lacinda Axon of physical exam change.
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
## 2017-07-03 NOTE — Progress Notes (Signed)
OT Cancellation Note  Patient Details Name: Carlos Mueller MRN: [id number] DOB: 29-Jan-1961   Cancelled Treatment:    Reason Eval/Treat Not Completed: Medical issues which prohibited therapy. Pt still pending upright x-rays in brace to assess stability prior to clearing for therapy. Will continue to hold OT evaluation until pt is cleared to proceed by neuro MD.  Jeni Salles, MPH, MS, OTR/L ascom [phone] 07/03/17, 8:53 AM
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
## 2017-07-03 NOTE — Plan of Care (Signed)
Problem: Safety: Goal: Ability to remain free from injury will improve Outcome: Progressing  Patient's VSS throughout the shift. Patient complaints of pain, PRN Norco and Toradol given. Patient rested well. RN will continue to monitor.

## 2017-07-04 ENCOUNTER — Other Ambulatory Visit: Payer: Self-pay | Admitting: Nurse Practitioner

## 2017-07-04 ENCOUNTER — Telehealth: Payer: Self-pay | Admitting: Cardiovascular Disease

## 2017-07-04 ENCOUNTER — Inpatient Hospital Stay: Payer: 59

## 2017-07-04 DIAGNOSIS — R55 Syncope and collapse: Secondary | ICD-10-CM

## 2017-07-04 LAB — GLUCOSE, CAPILLARY
GLUCOSE-CAPILLARY: 112 mg/dL — AB (ref 65–99)
GLUCOSE-CAPILLARY: 240 mg/dL — AB (ref 65–99)

## 2017-07-04 MED ORDER — NOVOLOG FLEXPEN 100 UNIT/ML ~~LOC~~ SOPN: PEN_INJECTOR | SUBCUTANEOUS | 5 refills | Status: DC

## 2017-07-04 MED ORDER — HYDROCODONE-ACETAMINOPHEN 7.5-325 MG PO TABS: 1.0000 | ORAL_TABLET | Freq: Four times a day (QID) | ORAL | 0 refills | Status: DC | PRN

## 2017-07-04 MED ORDER — LIDOCAINE 5 % EX PTCH: 1.0000 | MEDICATED_PATCH | CUTANEOUS | 0 refills | Status: DC

## 2017-07-04 MED ORDER — INSULIN DETEMIR 100 UNIT/ML ~~LOC~~ SOLN: 8.0000 [IU] | Freq: Two times a day (BID) | SUBCUTANEOUS | 11 refills | Status: DC

## 2017-07-04 MED ORDER — TRAMADOL HCL 50 MG PO TABS: 50.0000 mg | ORAL_TABLET | Freq: Four times a day (QID) | ORAL | 0 refills | Status: DC | PRN

## 2017-07-04 MED ORDER — SENNOSIDES-DOCUSATE SODIUM 8.6-50 MG PO TABS: 1.0000 | ORAL_TABLET | Freq: Two times a day (BID) | ORAL | 0 refills | Status: DC

## 2017-07-04 NOTE — Telephone Encounter (Signed)
Attempted to contact patient to cancel nurse visit. Per [facility] monitor will be mailed to residence.  Patient doesn't need to be on waitlist 08/05/17 Appt is ok

## 2017-07-04 NOTE — Progress Notes (Signed)
Report called to Bill, LPN of [place],
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
## 2017-07-04 NOTE — Plan of Care (Signed)
Problem: Safety: Goal: Ability to remain free from injury will improve Outcome: Progressing  Patient's VSS throughout the shift. Patient complaints of pain, PRN Norco given. Patient rested well. RN will continue to monitor.
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
## 2017-07-04 NOTE — Progress Notes (Signed)
Discharge instructions reviewed with patient and sister. Patient verbalized understanding of instructions. Signed prescriptions in envelope. NAD noted upon discharge. IV removed, pressure dressing applied.

## 2017-07-04 NOTE — Telephone Encounter (Signed)
Attempted to contact pt to make him aware that [employer] will be mailing monitor to his home. No answer, no vm set up. Entered demographics into [employer] website and make note that pt is currently hospitalized and request that they continue to try to contact him.

## 2017-07-04 NOTE — Clinical Social Work Note (Signed)
Patient to be d/c'ed today to [facility].  Patient and family agreeable to plans will transport via ems RN to call report [phone].  Evette Cristal, MSW, [place]

## 2017-07-22 ENCOUNTER — Telehealth: Payer: Self-pay | Admitting: Nurse Practitioner

## 2017-07-22 NOTE — Telephone Encounter (Signed)
Received faxed notification from [employer] that they have been unable to verify pt's address to ship monitor.  I attempted to contact the patient. No answer, no voice mail. Letter mailed.

## 2017-07-23 NOTE — Telephone Encounter (Signed)
Called pt at number listed. No answer, no voice mail.  [employer] is unable to mail monitor as they have been unable to reach pt via phone to confirm appt.

## 2017-07-23 NOTE — Telephone Encounter (Signed)
Spoke to patient wife, updated new address

## 2017-07-23 NOTE — Telephone Encounter (Signed)
S/w pt's sister, Arville Go, (ok per DPR) who reports pt is living with her now and states pt can be reached at her number, [phone]. I s/w [employer] to provide this information. Pt order was cancelled 9/14.  I have reentered the information and [employer] will call pt today to confirm information.

## 2017-07-27 DIAGNOSIS — E871 Hypo-osmolality and hyponatremia: Secondary | ICD-10-CM | POA: Insufficient documentation

## 2017-07-29 ENCOUNTER — Telehealth: Payer: Self-pay | Admitting: Nurse Practitioner

## 2017-07-29 NOTE — Telephone Encounter (Signed)
Pt sister states pt is being treated for meningitis and will not need the heart monitor. Please call.

## 2017-07-29 NOTE — Telephone Encounter (Signed)
S/w patient's sister. Patient is currently admitted at [facility] and being treated for meningitis s/p seizure. She received the [employer] monitor in the mail yesterday but the patient cannot wear it at this time. She is not sure how long the patient will be in the hospital. Patient's hospital f/u appointment with our office cancelled as well by sister. Advised for her to call us back once patient is out of hospital to reschedule appointment for cardiac workup as advised at discharge from [facility]. She is going to call [employer] and see about mailing monitor back to the company as she does not know when patient will get out of the hospital.

## 2017-08-05 ENCOUNTER — Ambulatory Visit: Payer: 59 | Admitting: Nurse Practitioner

## 2017-08-07 ENCOUNTER — Other Ambulatory Visit
Admission: RE | Admit: 2017-08-07 | Discharge: 2017-08-07 | Disposition: A | Discharge status: Home / Self Care | Payer: 59 | Source: Ambulatory Visit | Attending: Family Medicine | Admitting: Family Medicine

## 2017-08-07 DIAGNOSIS — Z5181 Encounter for therapeutic drug level monitoring: Secondary | ICD-10-CM | POA: Insufficient documentation

## 2017-08-07 DIAGNOSIS — G039 Meningitis, unspecified: Secondary | ICD-10-CM | POA: Diagnosis present

## 2017-08-07 LAB — CREATININE, SERUM
CREATININE: 0.55 mg/dL — AB (ref 0.61–1.24)
GFR calc non Af Amer: >60 mL/min (ref >60)

## 2017-08-07 LAB — GENTAMICIN LEVEL, PEAK: GENTAMICIN PK: 2.7 ug/mL — AB (ref 5.0–10.0)

## 2017-08-07 LAB — GENTAMICIN LEVEL, TROUGH

## 2017-08-11 ENCOUNTER — Other Ambulatory Visit
Admission: RE | Admit: 2017-08-11 | Discharge: 2017-08-11 | Disposition: A | Discharge status: Home / Self Care | Payer: 59 | Source: Other Acute Inpatient Hospital | Attending: Family Medicine | Admitting: Family Medicine

## 2017-08-11 DIAGNOSIS — E119 Type 2 diabetes mellitus without complications: Secondary | ICD-10-CM | POA: Insufficient documentation

## 2017-08-11 DIAGNOSIS — G039 Meningitis, unspecified: Secondary | ICD-10-CM | POA: Insufficient documentation

## 2017-08-11 LAB — CREATININE, SERUM: CREATININE: 0.66 mg/dL (ref 0.61–1.24)

## 2017-08-12 ENCOUNTER — Other Ambulatory Visit
Admission: RE | Admit: 2017-08-12 | Discharge: 2017-08-12 | Disposition: A | Discharge status: Home / Self Care | Payer: 59 | Source: Ambulatory Visit | Attending: Family Medicine | Admitting: Family Medicine

## 2017-08-12 DIAGNOSIS — A3211 Listerial meningitis: Secondary | ICD-10-CM | POA: Diagnosis not present

## 2017-08-12 LAB — CREATININE, SERUM
CREATININE: 0.66 mg/dL (ref 0.61–1.24)
GFR calc Af Amer: >60 mL/min (ref >60)

## 2017-08-12 LAB — GENTAMICIN LEVEL, TROUGH: Gentamicin Trough: 0.6 ug/mL (ref 0.5–2.0)

## 2017-08-12 LAB — GENTAMICIN LEVEL, PEAK: GENTAMICIN PK: 8.4 ug/mL (ref 5.0–10.0)

## 2017-08-14 ENCOUNTER — Other Ambulatory Visit
Admission: RE | Admit: 2017-08-14 | Discharge: 2017-08-14 | Disposition: A | Discharge status: Home / Self Care | Payer: 59 | Source: Ambulatory Visit | Attending: Family Medicine | Admitting: Family Medicine

## 2017-08-14 DIAGNOSIS — A3211 Listerial meningitis: Secondary | ICD-10-CM | POA: Diagnosis present

## 2017-08-14 LAB — CREATININE, SERUM: Creatinine, Ser: 0.45 mg/dL — ABNORMAL LOW (ref 0.61–1.24)

## 2017-08-14 LAB — GENTAMICIN LEVEL, PEAK: GENTAMICIN PK: 6.9 ug/mL (ref 5.0–10.0)

## 2017-08-14 LAB — GENTAMICIN LEVEL, TROUGH: GENTAMICIN TR: 0.8 ug/mL (ref 0.5–2.0)

## 2017-08-24 ENCOUNTER — Emergency Department: Payer: 59

## 2017-08-24 ENCOUNTER — Encounter: Payer: Self-pay | Admitting: *Deleted

## 2017-08-24 ENCOUNTER — Inpatient Hospital Stay
Admission: EM | Admit: 2017-08-24 | Discharge: 2017-08-29 | DRG: 872 | Disposition: A | Discharge status: Home / Self Care | Payer: 59 | Attending: Internal Medicine | Admitting: Internal Medicine

## 2017-08-24 DIAGNOSIS — K6389 Other specified diseases of intestine: Secondary | ICD-10-CM

## 2017-08-24 DIAGNOSIS — A419 Sepsis, unspecified organism: Secondary | ICD-10-CM | POA: Diagnosis not present

## 2017-08-24 DIAGNOSIS — Z681 Body mass index (BMI) 19 or less, adult: Secondary | ICD-10-CM

## 2017-08-24 DIAGNOSIS — E871 Hypo-osmolality and hyponatremia: Secondary | ICD-10-CM | POA: Diagnosis present

## 2017-08-24 DIAGNOSIS — R64 Cachexia: Secondary | ICD-10-CM | POA: Diagnosis present

## 2017-08-24 DIAGNOSIS — E039 Hypothyroidism, unspecified: Secondary | ICD-10-CM | POA: Diagnosis present

## 2017-08-24 DIAGNOSIS — K922 Gastrointestinal hemorrhage, unspecified: Secondary | ICD-10-CM | POA: Diagnosis present

## 2017-08-24 DIAGNOSIS — K523 Indeterminate colitis: Secondary | ICD-10-CM | POA: Diagnosis present

## 2017-08-24 DIAGNOSIS — K529 Noninfective gastroenteritis and colitis, unspecified: Secondary | ICD-10-CM

## 2017-08-24 DIAGNOSIS — Z794 Long term (current) use of insulin: Secondary | ICD-10-CM

## 2017-08-24 DIAGNOSIS — K921 Melena: Secondary | ICD-10-CM

## 2017-08-24 DIAGNOSIS — E109 Type 1 diabetes mellitus without complications: Secondary | ICD-10-CM | POA: Diagnosis present

## 2017-08-24 DIAGNOSIS — Z79890 Hormone replacement therapy (postmenopausal): Secondary | ICD-10-CM

## 2017-08-24 DIAGNOSIS — W19XXXA Unspecified fall, initial encounter: Secondary | ICD-10-CM

## 2017-08-24 DIAGNOSIS — A3211 Listerial meningitis: Secondary | ICD-10-CM | POA: Diagnosis present

## 2017-08-24 DIAGNOSIS — E876 Hypokalemia: Secondary | ICD-10-CM | POA: Diagnosis present

## 2017-08-24 DIAGNOSIS — Z8249 Family history of ischemic heart disease and other diseases of the circulatory system: Secondary | ICD-10-CM

## 2017-08-24 HISTORY — DX: Listerial meningitis: A32.11

## 2017-08-24 HISTORY — DX: Unspecified fracture of unspecified lumbar vertebra, initial encounter for closed fracture: S32.009A

## 2017-08-24 LAB — PROTIME-INR
INR: 0.98
PROTHROMBIN TIME: 12.9 s (ref 11.4–15.2)

## 2017-08-24 LAB — APTT: aPTT: 30 seconds (ref 24–36)

## 2017-08-24 LAB — COMPREHENSIVE METABOLIC PANEL
ALBUMIN: 3.1 g/dL — AB (ref 3.5–5.0)
ALT: 19 U/L (ref 17–63)
AST: 21 U/L (ref 15–41)
Alkaline Phosphatase: 74 U/L (ref 38–126)
Anion gap: 6 (ref 5–15)
BUN: 11 mg/dL (ref 6–20)
CO2: 27 mmol/L (ref 22–32)
Calcium: 8.7 mg/dL — ABNORMAL LOW (ref 8.9–10.3)
Chloride: 99 mmol/L — ABNORMAL LOW (ref 101–111)
Creatinine, Ser: 0.56 mg/dL — ABNORMAL LOW (ref 0.61–1.24)
GFR calc Af Amer: >60 mL/min (ref >60)
GLUCOSE: 164 mg/dL — AB (ref 65–99)
POTASSIUM: 3.6 mmol/L (ref 3.5–5.1)
SODIUM: 132 mmol/L — AB (ref 135–145)
Total Bilirubin: 0.5 mg/dL (ref 0.3–1.2)
Total Protein: 6.8 g/dL (ref 6.5–8.1)

## 2017-08-24 LAB — GASTROINTESTINAL PANEL BY PCR, STOOL (REPLACES STOOL CULTURE)
ADENOVIRUS F40/41: NOT DETECTED
ASTROVIRUS: NOT DETECTED
Campylobacter species: NOT DETECTED
Cryptosporidium: NOT DETECTED
Cyclospora cayetanensis: NOT DETECTED
ENTAMOEBA HISTOLYTICA: NOT DETECTED
ENTEROAGGREGATIVE E COLI (EAEC): NOT DETECTED
ENTEROPATHOGENIC E COLI (EPEC): NOT DETECTED
ENTEROTOXIGENIC E COLI (ETEC): NOT DETECTED
GIARDIA LAMBLIA: NOT DETECTED
NOROVIRUS GI/GII: NOT DETECTED
Plesimonas shigelloides: NOT DETECTED
Rotavirus A: NOT DETECTED
SAPOVIRUS (I, II, IV, AND V): NOT DETECTED
SHIGA LIKE TOXIN PRODUCING E COLI (STEC): NOT DETECTED
Salmonella species: NOT DETECTED
Shigella/Enteroinvasive E coli (EIEC): NOT DETECTED
VIBRIO CHOLERAE: NOT DETECTED
Vibrio species: NOT DETECTED
Yersinia enterocolitica: NOT DETECTED

## 2017-08-24 LAB — CBC
HEMATOCRIT: 35.5 % — AB (ref 40.0–52.0)
Hemoglobin: 11.6 g/dL — ABNORMAL LOW (ref 13.0–18.0)
MCH: 26.5 pg (ref 26.0–34.0)
MCHC: 32.5 g/dL (ref 32.0–36.0)
MCV: 81.7 fL (ref 80.0–100.0)
PLATELETS: 398 10*3/uL (ref 150–440)
RBC: 4.35 MIL/uL — ABNORMAL LOW (ref 4.40–5.90)
RDW: 20.9 % — AB (ref 11.5–14.5)
WBC: 17.4 10*3/uL — AB (ref 3.8–10.6)

## 2017-08-24 LAB — PREPARE RBC (CROSSMATCH)

## 2017-08-24 LAB — LACTIC ACID, PLASMA: LACTIC ACID, VENOUS: 0.9 mmol/L (ref 0.5–1.9)

## 2017-08-24 LAB — C DIFFICILE QUICK SCREEN W PCR REFLEX
C DIFFICILE (CDIFF) INTERP: NOT DETECTED
C DIFFICILE (CDIFF) TOXIN: NEGATIVE
C Diff antigen: NEGATIVE

## 2017-08-24 MED ORDER — SODIUM CHLORIDE 0.9 % IV BOLUS (SEPSIS)
1000.0000 mL | Freq: Once | INTRAVENOUS | Status: AC
  Administered 2017-08-24: 1000 mL via INTRAVENOUS

## 2017-08-24 MED ORDER — PIPERACILLIN-TAZOBACTAM 3.375 G IVPB 30 MIN
3.3750 g | Freq: Once | INTRAVENOUS | Status: AC
  Administered 2017-08-24: 3.375 g via INTRAVENOUS
  Filled 2017-08-24: qty 50

## 2017-08-24 MED ORDER — IOPAMIDOL (ISOVUE-300) INJECTION 61%
75.0000 mL | Freq: Once | INTRAVENOUS | Status: AC | PRN
  Administered 2017-08-24: 75 mL via INTRAVENOUS

## 2017-08-24 NOTE — ED Provider Notes (Signed)
[facility] Emergency Department Provider Note  ____________________________________________  Time seen: Approximately 8:29 PM  I have reviewed the triage vital signs and the nursing notes.   HISTORY  Chief Complaint Abdominal Pain and Rectal Bleeding   HPI Carlos Mueller is a 56 y.o. 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 patient reports that he woke up at 3 AM with cramping sharp and diffuse abdominal pain. Since then he has been going to the bathroom every 20-40 minutes. Every time he has loose stool with a lot of bright red blood with it. He has been nauseated and had 1 episode of nonbloody nonbilious emesis. No fever or chills. Patient is not on blood thinners. He denies ever having anything similar in the past. Patient is also complaining of dizziness. No chest pain or shortness of breath. Currently his abdominal pain is moderate. No dysuria or hematuria. No history of C. difficile but patient did have recent hospitalization for listeria meningitis and is currently in rehabilitation.  Past Medical History:  Diagnosis Date  . Hypothyroidism   . Lumbar vertebral fracture (HCC)   . 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 Type 1 diabetes [facility])     Patient Active Problem List   Diagnosis Date Noted  . Insulin dependent diabetes mellitus ([place]) 06/30/2017  . Symptomatic bradycardia 06/30/2017  . Syncope 06/28/2017    History reviewed. No pertinent surgical history.  Prior to Admission medications   Medication Sig Start Date End Date Taking? Authorizing Provider  HYDROcodone-acetaminophen (NORCO) 7.5-325 MG tablet Take 1 tablet by mouth every 6 (six) hours as needed for moderate pain. 07/04/17   Bettey Costa, MD  insulin detemir (LEVEMIR) 100 UNIT/ML injection Inject 0.08 mLs (8 Units total) into the skin 2 (two)  times daily. 07/04/17   Bettey Costa, MD  levothyroxine (SYNTHROID, LEVOTHROID) 125 MCG tablet TAKE ONE TABLET BY MOUTH EVERY MORNING BEFORE BREAKFAST] 06/24/17   [provider]  lidocaine (LIDODERM) 5 % Place 1 patch onto the skin daily. Remove & Discard patch within 12 hours or as directed by MD 07/04/17   Bettey Costa, MD  NOVOLOG FLEXPEN 100 UNIT/ML FlexPen INJECT 4 UNITS SUBCUTANEOUSLY EVERY MORNING, 3 UNITS AT NOON AND 3 UNITS AT NIGHT 07/04/17   Bettey Costa, MD  senna-docusate (SENOKOT-S) 8.6-50 MG tablet Take 1 tablet by mouth 2 (two) times daily. 07/04/17   Bettey Costa, MD  traMADol (ULTRAM) 50 MG tablet Take 1 tablet (50 mg total) by mouth every 6 (six) hours as needed. 07/04/17 07/04/18  Bettey Costa, MD    Allergies Patient has no known allergies.  Family History  Problem Relation Age of Onset  . Hypertension Mother     Social History Social History  Substance Use Topics  . Smoking status: Never Smoker  . Smokeless tobacco: Never Used  . Alcohol use No    Review of Systems  Constitutional: Negative for fever. Eyes: Negative for visual changes. ENT: Negative for sore throat. Neck: No neck pain  Cardiovascular: Negative for chest pain. Respiratory: Negative for shortness of breath. Gastrointestinal: + diffuse cramping abdominal pain, vomiting and BRBPR/ diarrhea. Genitourinary: Negative for dysuria. Musculoskeletal: Negative for back pain. Skin: Negative for rash. Neurological: Negative for headaches, weakness or numbness. Psych: No SI or HI  ____________________________________________   PHYSICAL EXAM:  VITAL SIGNS: ED Triage Vitals  Enc Vitals Group  BP 08/24/17 1953 (!) 151/85     Pulse Rate 08/24/17 1953 96     Resp 08/24/17 1953 20     Temp 08/24/17 1953 98.3 F (36.8 C)     Temp Source 08/24/17 1953 Oral     SpO2 08/24/17 1946 97 %     Weight 08/24/17 1953 109 lb (49.4 kg)     Height 08/24/17 1953 5\' 8"  (1.727 m)     Head Circumference --       [facility] Flow --      Pain Score 08/24/17 1951 9     Pain Loc --      Pain Edu? --      Excl. in [place]? --     Constitutional: Alert and oriented, cachetic.  HEENT:      Head: Normocephalic and atraumatic.         Eyes: Conjunctivae are normal. Sclera is non-icteric.       Mouth/Throat: Mucous membranes are dry.       Neck: Supple with no signs of meningismus. Cardiovascular: Regular rate and rhythm. No murmurs, gallops, or rubs. 2+ symmetrical distal pulses are present in all extremities. No JVD. Respiratory: Normal respiratory effort. Lungs are clear to auscultation bilaterally. No wheezes, crackles, or rhonchi.  Gastrointestinal: Soft, diffusely tender to palpation, and non distended with positive bowel sounds. No rebound or guarding. Genitourinary: No CVA tenderness. Large amount of bright blood in commode with loose stool Musculoskeletal: Nontender with normal range of motion in all extremities. No edema, cyanosis, or erythema of extremities. Neurologic: Normal speech and language. Face is symmetric. Moving all extremities. No gross focal neurologic deficits are appreciated. Skin: Skin is warm, dry and intact. No rash noted. Psychiatric: Mood and affect are normal. Speech and behavior are normal.  ____________________________________________   LABS (all labs ordered are listed, but only abnormal results are displayed)  Labs Reviewed  COMPREHENSIVE METABOLIC PANEL - Abnormal; Notable for the following:       Result Value   Sodium 132 (*)    Chloride 99 (*)    Glucose, Bld 164 (*)    Creatinine, Ser 0.56 (*)    Calcium 8.7 (*)    Albumin 3.1 (*)    All other components within normal limits  CBC - Abnormal; Notable for the following:    WBC 17.4 (*)    RBC 4.35 (*)    Hemoglobin 11.6 (*)    HCT 35.5 (*)    RDW 20.9 (*)    All other components within normal limits  C DIFFICILE QUICK SCREEN W PCR REFLEX  GASTROINTESTINAL PANEL BY PCR, STOOL (REPLACES STOOL CULTURE)  LACTIC  ACID, PLASMA  PROTIME-INR  APTT  HEMOGLOBIN AND HEMATOCRIT, BLOOD  POC OCCULT BLOOD, ED  TYPE AND SCREEN  PREPARE RBC (CROSSMATCH)   ____________________________________________  EKG  none ____________________________________________  RADIOLOGY  CT a/p:  1. Findings consistent with acute colitis, with greatest involvement at the descending and sigmoid colon. Associated pneumatosis within the ascending and transverse colon. Correlation with serum lactate recommended. No portal venous gas. 2. Further collapse of the previously seen comminuted L4 vertebral fracture, now measuring up to 70% with 4 mm bony retropulsion. A superimposed acute component is not excluded. 3. No other acute abnormality within the abdomen and pelvis. ____________________________________________   PROCEDURES  Procedure(s) performed: None Procedures Critical Care performed: yes  CRITICAL CARE Performed by: Rudene Re  ?  Total critical care time: 40 min  Critical care time was exclusive of  separately billable procedures and treating other patients.  Critical care was necessary to treat or prevent imminent or life-threatening deterioration.  Critical care was time spent personally by me on the following activities: development of treatment plan with patient and/or surrogate as well as nursing, discussions with consultants, evaluation of patient's response to treatment, examination of patient, obtaining history from patient or surrogate, ordering and performing treatments and interventions, ordering and review of laboratory studies, ordering and review of radiographic studies, pulse oximetry and re-evaluation of patient's condition.  ____________________________________________   INITIAL IMPRESSION / ASSESSMENT AND PLAN / ED COURSE   56 y.o. 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 patient reports having large amount of bright red blood per rectum every 20-40 minutes since 3AM. Already had 2 episodes here which consist of large amount of bright red blood with some amount of watery stool mixed with it. Differential diagnosis including C. difficile, diverticular bleed, a fast bleeding peptic ulcer. Will check H&H, type and screen. Large bore IV placed. Will give IVF. Coags pending. Stool sent for C. Diff.  Clinical Course as of Aug 24 2344  Nancy Fetter Aug 24, 2017  2316 patient reports more than 10 episodes of bloody stool here in the emergency department. He has been here for 3.5 hours. Stool culture and C. Diff negative. H&H stable, will get repeat H&H 4 hours from the first one.  [CV]    Clinical Course User Index [CV] Alfred Levins [place], MD   _________________________ 11:37 PM on 08/24/2017 -----------------------------------------  CT concerning for colitis with pneumatosis, elevated WBC and normal lactic acid, meeting criteria for sepsis. Will start patient on zosyn and consult surgery. Spoke with Dr. Jamal Collin, surgeon on call who recommended IV abx and admission to Medicine. I explained to Dr. Jamal Collin that based on labs findings and pneumatosis on CT patient meest sepsis criteria. He says he will see patient in the am. Last BM is now more consistent with less blood. Remains HD stable, Will discuss with Dr. Jannifer Franklin for admission.      As part of my medical decision making, I reviewed the following data within the [place] History obtained from family, Nursing notes reviewed and incorporated, Labs reviewed , Old chart reviewed, Radiograph reviewed , Discussed with admitting physician , Notes from prior ED visits and [place] Controlled Substance Database    Pertinent labs & imaging results that were available during my care of the patient were reviewed by me and considered in my medical decision making (see chart for  details).    ____________________________________________   FINAL CLINICAL IMPRESSION(S) / ED DIAGNOSES  Final diagnoses:  Colitis  Pneumatosis intestinalis  Hematochezia  Sepsis, due to unspecified organism [facility])      NEW MEDICATIONS STARTED DURING THIS VISIT:  New Prescriptions   No medications on file     Note:  This document was prepared using Dragon voice recognition software and may include unintentional dictation errors.    Rudene Re, MD 08/24/17 [phone]

## 2017-08-24 NOTE — ED Notes (Signed)
Patient transported to CT 
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
## 2017-08-25 ENCOUNTER — Encounter: Payer: Self-pay | Admitting: Internal Medicine

## 2017-08-25 DIAGNOSIS — K921 Melena: Secondary | ICD-10-CM

## 2017-08-25 DIAGNOSIS — A3211 Listerial meningitis: Secondary | ICD-10-CM | POA: Diagnosis present

## 2017-08-25 DIAGNOSIS — K922 Gastrointestinal hemorrhage, unspecified: Secondary | ICD-10-CM | POA: Diagnosis not present

## 2017-08-25 DIAGNOSIS — R64 Cachexia: Secondary | ICD-10-CM | POA: Diagnosis present

## 2017-08-25 DIAGNOSIS — E039 Hypothyroidism, unspecified: Secondary | ICD-10-CM | POA: Diagnosis present

## 2017-08-25 DIAGNOSIS — K529 Noninfective gastroenteritis and colitis, unspecified: Secondary | ICD-10-CM | POA: Diagnosis not present

## 2017-08-25 DIAGNOSIS — K6389 Other specified diseases of intestine: Secondary | ICD-10-CM | POA: Diagnosis not present

## 2017-08-25 DIAGNOSIS — Z794 Long term (current) use of insulin: Secondary | ICD-10-CM | POA: Diagnosis not present

## 2017-08-25 DIAGNOSIS — A419 Sepsis, unspecified organism: Secondary | ICD-10-CM | POA: Diagnosis present

## 2017-08-25 DIAGNOSIS — K523 Indeterminate colitis: Secondary | ICD-10-CM

## 2017-08-25 DIAGNOSIS — E871 Hypo-osmolality and hyponatremia: Secondary | ICD-10-CM | POA: Diagnosis present

## 2017-08-25 DIAGNOSIS — Z8249 Family history of ischemic heart disease and other diseases of the circulatory system: Secondary | ICD-10-CM | POA: Diagnosis not present

## 2017-08-25 DIAGNOSIS — Z79890 Hormone replacement therapy (postmenopausal): Secondary | ICD-10-CM | POA: Diagnosis not present

## 2017-08-25 DIAGNOSIS — E109 Type 1 diabetes mellitus without complications: Secondary | ICD-10-CM | POA: Diagnosis present

## 2017-08-25 DIAGNOSIS — Z681 Body mass index (BMI) 19 or less, adult: Secondary | ICD-10-CM | POA: Diagnosis not present

## 2017-08-25 DIAGNOSIS — E876 Hypokalemia: Secondary | ICD-10-CM | POA: Diagnosis present

## 2017-08-25 LAB — BASIC METABOLIC PANEL
ANION GAP: 6 (ref 5–15)
BUN: 10 mg/dL (ref 6–20)
CHLORIDE: 97 mmol/L — AB (ref 101–111)
CO2: 26 mmol/L (ref 22–32)
CREATININE: 0.62 mg/dL (ref 0.61–1.24)
Calcium: 8.4 mg/dL — ABNORMAL LOW (ref 8.9–10.3)
GFR calc non Af Amer: >60 mL/min (ref >60)
Glucose, Bld: 248 mg/dL — ABNORMAL HIGH (ref 65–99)
Potassium: 3.9 mmol/L (ref 3.5–5.1)
Sodium: 129 mmol/L — ABNORMAL LOW (ref 135–145)

## 2017-08-25 LAB — HEMOGLOBIN AND HEMATOCRIT, BLOOD
HCT: 34.3 % — ABNORMAL LOW (ref 40.0–52.0)
HCT: 36.2 % — ABNORMAL LOW (ref 40.0–52.0)
HEMOGLOBIN: 11.1 g/dL — AB (ref 13.0–18.0)
Hemoglobin: 11.7 g/dL — ABNORMAL LOW (ref 13.0–18.0)

## 2017-08-25 LAB — CBC
HEMATOCRIT: 36.2 % — AB (ref 40.0–52.0)
Hemoglobin: 11.7 g/dL — ABNORMAL LOW (ref 13.0–18.0)
MCH: 26.6 pg (ref 26.0–34.0)
MCHC: 32.3 g/dL (ref 32.0–36.0)
MCV: 82.3 fL (ref 80.0–100.0)
Platelets: 401 10*3/uL (ref 150–440)
RBC: 4.4 MIL/uL (ref 4.40–5.90)
RDW: 21.1 % — AB (ref 11.5–14.5)
WBC: 18.3 10*3/uL — AB (ref 3.8–10.6)

## 2017-08-25 LAB — GLUCOSE, CAPILLARY
GLUCOSE-CAPILLARY: 121 mg/dL — AB (ref 65–99)
GLUCOSE-CAPILLARY: 168 mg/dL — AB (ref 65–99)
GLUCOSE-CAPILLARY: 56 mg/dL — AB (ref 65–99)
GLUCOSE-CAPILLARY: 83 mg/dL (ref 65–99)
Glucose-Capillary: 278 mg/dL — ABNORMAL HIGH (ref 65–99)
Glucose-Capillary: 103 mg/dL — ABNORMAL HIGH (ref 65–99)

## 2017-08-25 MED ORDER — PIPERACILLIN-TAZOBACTAM 3.375 G IVPB 30 MIN: 3.3750 g | Freq: Four times a day (QID) | INTRAVENOUS | Status: DC

## 2017-08-25 MED ORDER — ONDANSETRON HCL 4 MG PO TABS: 4.0000 mg | ORAL_TABLET | Freq: Four times a day (QID) | ORAL | Status: DC | PRN

## 2017-08-25 MED ORDER — DIVALPROEX SODIUM 500 MG PO DR TAB
500.0000 mg | DELAYED_RELEASE_TABLET | Freq: Two times a day (BID) | ORAL | Status: DC
  Administered 2017-08-25 – 2017-08-29 (×9): 500 mg via ORAL
  Filled 2017-08-25 (×11): qty 1

## 2017-08-25 MED ORDER — DEXTROSE 50 % IV SOLN
INTRAVENOUS | Status: AC
  Administered 2017-08-25: 25 mL
  Filled 2017-08-25: qty 50

## 2017-08-25 MED ORDER — SODIUM CHLORIDE 0.9 % IV SOLN: INTRAVENOUS | Status: DC

## 2017-08-25 MED ORDER — ONDANSETRON HCL 4 MG/2ML IJ SOLN: 4.0000 mg | Freq: Four times a day (QID) | INTRAMUSCULAR | Status: DC | PRN

## 2017-08-25 MED ORDER — SODIUM CHLORIDE 0.9 % IV SOLN
INTRAVENOUS | Status: AC
  Administered 2017-08-25: 02:00:00 via INTRAVENOUS

## 2017-08-25 MED ORDER — PIPERACILLIN-TAZOBACTAM 3.375 G IVPB: 3.3750 g | Freq: Three times a day (TID) | INTRAVENOUS | Status: DC

## 2017-08-25 MED ORDER — ACETAMINOPHEN 650 MG RE SUPP: 650.0000 mg | Freq: Four times a day (QID) | RECTAL | Status: DC | PRN

## 2017-08-25 MED ORDER — INSULIN ASPART 100 UNIT/ML ~~LOC~~ SOLN
0.0000 [IU] | Freq: Four times a day (QID) | SUBCUTANEOUS | Status: DC
  Administered 2017-08-25: 3 [IU] via SUBCUTANEOUS
  Administered 2017-08-25: 5 [IU] via SUBCUTANEOUS
  Administered 2017-08-26: 3 [IU] via SUBCUTANEOUS
  Administered 2017-08-27 (×2): 2 [IU] via SUBCUTANEOUS
  Filled 2017-08-25 (×5): qty 1

## 2017-08-25 MED ORDER — ACETAMINOPHEN 325 MG PO TABS
650.0000 mg | ORAL_TABLET | Freq: Four times a day (QID) | ORAL | Status: DC | PRN
  Administered 2017-08-25 – 2017-08-28 (×8): 650 mg via ORAL
  Filled 2017-08-25 (×8): qty 2

## 2017-08-25 MED ORDER — LOPERAMIDE HCL 2 MG PO CAPS
2.0000 mg | ORAL_CAPSULE | Freq: Four times a day (QID) | ORAL | Status: DC | PRN
  Administered 2017-08-25 – 2017-08-26 (×2): 2 mg via ORAL
  Filled 2017-08-25: qty 1

## 2017-08-25 MED ORDER — LEVOTHYROXINE SODIUM 50 MCG PO TABS
125.0000 ug | ORAL_TABLET | Freq: Every day | ORAL | Status: DC
  Administered 2017-08-25 – 2017-08-29 (×4): 125 ug via ORAL
  Filled 2017-08-25 (×4): qty 3

## 2017-08-25 MED ORDER — METRONIDAZOLE IN NACL 5-0.79 MG/ML-% IV SOLN
500.0000 mg | Freq: Three times a day (TID) | INTRAVENOUS | Status: DC
  Administered 2017-08-25 (×2): 500 mg via INTRAVENOUS
  Filled 2017-08-25 (×4): qty 100

## 2017-08-25 MED ORDER — LOPERAMIDE HCL 2 MG PO CAPS
2.0000 mg | ORAL_CAPSULE | Freq: Once | ORAL | Status: AC
  Administered 2017-08-25: 2 mg via ORAL
  Filled 2017-08-25: qty 1

## 2017-08-25 MED ORDER — DEXTROSE-NACL 5-0.9 % IV SOLN
INTRAVENOUS | Status: DC
  Administered 2017-08-25 – 2017-08-28 (×6): via INTRAVENOUS

## 2017-08-25 MED ORDER — PANTOPRAZOLE SODIUM 40 MG PO TBEC
40.0000 mg | DELAYED_RELEASE_TABLET | Freq: Every day | ORAL | Status: DC
  Administered 2017-08-26 – 2017-08-29 (×4): 40 mg via ORAL
  Filled 2017-08-25 (×4): qty 1

## 2017-08-25 MED ORDER — INSULIN DETEMIR 100 UNIT/ML ~~LOC~~ SOLN
15.0000 [IU] | Freq: Every day | SUBCUTANEOUS | Status: DC
  Administered 2017-08-25 – 2017-08-26 (×2): 15 [IU] via SUBCUTANEOUS
  Filled 2017-08-25 (×3): qty 0.15

## 2017-08-25 MED ORDER — LOPERAMIDE HCL 2 MG PO CAPS
2.0000 mg | ORAL_CAPSULE | ORAL | Status: DC | PRN
  Filled 2017-08-25: qty 1

## 2017-08-25 MED ORDER — OXYCODONE HCL 5 MG PO TABS
5.0000 mg | ORAL_TABLET | ORAL | Status: DC | PRN
Start: 2017-08-25 — End: 2017-08-29
  Administered 2017-08-25 – 2017-08-27 (×5): 5 mg via ORAL
  Filled 2017-08-25 (×7): qty 1

## 2017-08-25 MED ORDER — CIPROFLOXACIN IN D5W 400 MG/200ML IV SOLN
400.0000 mg | Freq: Two times a day (BID) | INTRAVENOUS | Status: DC
  Administered 2017-08-25: 400 mg via INTRAVENOUS
  Filled 2017-08-25 (×2): qty 200

## 2017-08-25 MED ORDER — SODIUM CHLORIDE 0.9 % IV SOLN
3.0000 g | Freq: Four times a day (QID) | INTRAVENOUS | Status: DC
  Administered 2017-08-25 – 2017-08-27 (×8): 3 g via INTRAVENOUS
  Administered 2017-08-27: 21:00:00 via INTRAVENOUS
  Administered 2017-08-27 – 2017-08-29 (×8): 3 g via INTRAVENOUS
  Filled 2017-08-25 (×23): qty 3

## 2017-08-25 NOTE — Clinical Social Work Note (Signed)
Clinical Social Work Assessment  Patient Details  Name: Carlos Mueller MRN: [id number] Date of Birth: Apr 17, 1961  Date of referral:  08/25/17               Reason for consult:  Other (Comment Required) (From [facility] )                Permission sought to share information with:  Chartered certified accountant granted to share information::  Yes, Verbal Permission Granted  Name::      [facility]  Agency::   [place]   Relationship::     Contact Information:     Housing/Transportation Living arrangements for the past 2 months:  [place], [place] of Information:  Patient, Other (Comment Required) (Sister and brother in Sports coach. ) Patient Interpreter Needed:  None Criminal Activity/Legal Involvement Pertinent to Current Situation/Hospitalization:  No - Comment as needed Significant Relationships:  Siblings Lives with:  Self, Facility Resident Do you feel safe going back to the place where you live?  Yes Need for family participation in patient care:  Yes (Comment)  Care giving concerns:  Patient came into [facility] from [facility] where he is at for short term rehab.    Social Worker assessment / plan:  Holiday representative (CSW) reviewed chart and noted that patient is from [facility]. Per [facility] liaison patient is a short term rehab resident and can return to [facility] when stable. CSW met with patient and his sister [name] cell # [phone] and his brother in law Richardson Landry cell # [phone] and home # [phone] were at bedside. Patient was alert and oriented X4 and was laying in the bed. CSW introduced self and explained role of CSW department. Patient reported that he has been at [facility] for short term rehab and IV ABX. Per sister she paid for a bed hold at [facility] but can't continue to pay for a bed hold for a long time. Patient and his sister are agreeable for patient to return to [facility]. FL2 complete and faxed to [facility]. CSW will continue to follow  and assist as needed.   Employment status:  Disabled (Comment on whether or not currently receiving Disability) Insurance information:  Managed Medicare PT Recommendations:  Not assessed at this time Information / Referral to community resources:  [place]  Patient/Family's Response to care:  Patient and his sister are agreeable for patient to go back to [facility].   Patient/Family's Understanding of and Emotional Response to Diagnosis, Current Treatment, and Prognosis:  Patient and his sister were pleasant and thanked CSW for assistance.   Emotional Assessment Appearance:  Appears stated age Attitude/Demeanor/Rapport:    Affect (typically observed):  Accepting, Adaptable, Pleasant Orientation:  Oriented to Self, Oriented to Place, Oriented to  Time, Oriented to Situation Alcohol / Substance use:  Not Applicable Psych involvement (Current and /or in the community):  No (Comment)  Discharge Needs  Concerns to be addressed:  Discharge Planning Concerns Readmission within the last 30 days:  No Current discharge risk:  Dependent with Mobility Barriers to Discharge:  Continued Medical Work up   [employer], Veronia Beets, LCSW 08/25/2017, 2:26 PM

## 2017-08-25 NOTE — Consult Note (Addendum)
SURGICAL CONSULTATION NOTE (initial) - cpt: 99254  HISTORY OF PRESENT ILLNESS (HPI):  56 y.o. male presented to [facility] ED for evaluation of GI bleed and acute onset of suprapubic > LLQ abdominal pain since 3 am the night/morning prior to admission while at an inpatient care facility recovering on antibiotics from listerial meningitis. Patient reports he felt "perfectly fine" prior to the onset of "nothing but blood" "every 5 - 10 minutes". Patient otherwise describes a single episode of "clear" non-bloody emesis with ongoing +flatus, denies fever/chills, lightheadedness or dizziness, CP, or SOB. He also denies any prior colonoscopy or any prior similar episodes.  Surgery is consulted by medical physician Dr. Margaretmary Eddy in this context for evaluation and management of pancolitis with ascending and proximal transverse pneumatosis.  PAST MEDICAL HISTORY (PMH):  Past Medical History:  Diagnosis Date  . Hypothyroidism   . Lumbar vertebral fracture (HCC)   . 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 Type 1 diabetes ([place])      PAST SURGICAL HISTORY ([place]):  Past Surgical History:  Procedure Laterality Date  . NO PAST SURGERIES       MEDICATIONS:  Prior to Admission medications   Medication Sig Start Date End Date Taking? Authorizing Provider  ampicillin (OMNIPEN) 2 g injection Inject 2 g into the vein every 4 (four) hours.   Yes [provider]  calcium carbonate (OSCAL) 1500 (600 Ca) MG TABS tablet Take 600 mg by mouth daily with breakfast.   Yes [provider]  Cyanocobalamin 1000 MCG TBCR Take 1,000 mcg by mouth daily.   Yes [provider]  divalproex (DEPAKOTE) 500 MG DR tablet Take 500 mg by mouth 2 (two) times daily.   Yes [provider]  heparin flush 10 UNIT/ML SOLN injection Inject 50 Units into the vein 4 (four) times daily.   Yes [provider]  insulin aspart (NOVOLOG FLEXPEN) 100 UNIT/ML FlexPen Inject 0-5 Units into the skin 3 (three) times  daily with meals. If blood sugar is less than 60, call MD If blood sugar is 200-250, give 1 unit If blood sugar is 251-300, give 2 unit If blood sugar is 301-350, give 3 unit If blood sugar is 351-400, give 4 unit If blood sugar is 401-450, give 5 unit If blood sugar is greater than 450, call MD   Yes [provider]  insulin detemir (LEVEMIR) 100 UNIT/ML injection Inject 0.08 mLs (8 Units total) into the skin 2 (two) times daily. Patient taking differently: Inject 18-21 Units into the skin 2 (two) times daily. 21 units in the morning and 18 units at bedtime 07/04/17  Yes Mody, Sital, MD  insulin lispro (HUMALOG) 100 UNIT/ML injection Inject 6 Units into the skin 3 (three) times daily before meals.   Yes [provider]  levothyroxine (SYNTHROID, LEVOTHROID) 125 MCG tablet TAKE ONE TABLET BY MOUTH EVERY MORNING BEFORE BREAKFAST] 06/24/17  Yes [provider]  Multiple Vitamin (MULTIVITAMIN WITH MINERALS) TABS tablet Take 1 tablet by mouth daily.   Yes [provider]  oxyCODONE (OXY IR/ROXICODONE) 5 MG immediate release tablet Take 5 mg by mouth every 4 (four) hours as needed for severe pain.   Yes [provider]  senna (SENOKOT) 8.6 MG TABS tablet Take 1 tablet by mouth daily.   Yes [provider]  sodium chloride 0.9 % injection Inject 5 mLs into the vein as directed. Flush with 5 ml before and after antibiotic   Yes [provider]  Vitamin D, Ergocalciferol, (  DRISDOL) 50000 units CAPS capsule Take 50,000 Units by mouth every 7 (seven) days. Patient takes on Friday   Yes [provider]     ALLERGIES:  No Known Allergies   SOCIAL HISTORY:  Social History   Social History  . Marital status: Single    Spouse name: N/A  . Number of children: N/A  . Years of education: N/A   Occupational History  . Not on file.   Social History Main Topics  . Smoking status: Never Smoker  . Smokeless tobacco: Never Used  .  Alcohol use No  . Drug use: No  . Sexual activity: Not on file   Other Topics Concern  . Not on file   Social History Narrative  . No narrative on file    The patient currently resides (home / rehab facility / nursing home): Skilled nursing facility The patient normally is (ambulatory / bedbound): Ambulatory   FAMILY HISTORY:  Family History  Problem Relation Age of Onset  . Hypertension Mother      REVIEW OF SYSTEMS:  Constitutional: denies weight loss, fever, chills, or sweats  Eyes: denies any other vision changes, history of eye injury  ENT: denies sore throat, hearing problems  Respiratory: denies shortness of breath, wheezing  Cardiovascular: denies chest pain, palpitations  Gastrointestinal: abdominal pain, N/V, and bowel function as per HPI Genitourinary: denies burning with urination or urinary frequency Musculoskeletal: denies any other joint pains or cramps  Skin: denies any other rashes or skin discolorations  Neurological: denies any other headache, dizziness, weakness  Psychiatric: denies any other depression, anxiety   All other review of systems were negative   VITAL SIGNS:  Temp:  [98.2 F (36.8 C)-98.4 F (36.9 C)] 98.2 F (36.8 C) (10/22 0841) Pulse Rate:  [65-96] 73 (10/22 0841) Resp:  [18-20] 18 (10/22 0156) BP: (145-167)/(56-85) 145/62 (10/22 0841) SpO2:  [97 %-100 %] 100 % (10/22 0841) Weight:  [109 lb (49.4 kg)-112 lb 1.6 oz (50.8 kg)] 112 lb 1.6 oz (50.8 kg) (10/22 0156)     Height: 5\' 8"  (172.7 cm) Weight: 112 lb 1.6 oz (50.8 kg) BMI (Calculated): 17.05   INTAKE/OUTPUT:  This shift: No intake/output data recorded.  Last 2 shifts: @IOLAST2SHIFTS @   PHYSICAL EXAM:  Constitutional:  -- Thin body habitus  -- Awake, alert, and oriented x3  Eyes:  -- Pupils equally round and reactive to light  -- No scleral icterus  Ear, nose, and throat:  -- No jugular venous distension  Pulmonary:  -- No crackles  -- Equal breath sounds bilaterally --  Breathing non-labored at rest Cardiovascular:  -- S1, S2 present  -- No pericardial rubs Gastrointestinal:  -- Abdomen soft and non-distended with moderate suprapubic tenderness to palpation > mild LLQ tenderness to palpation, no generalized peritonitis, guarding, or rebound tenderness -- No abdominal masses appreciated, pulsatile or otherwise  Musculoskeletal and Integumentary:  -- Wounds or skin discoloration: None appreciated -- Extremities: B/L UE and LE FROM, hands and feet warm, no edema  Neurologic:  -- Motor function: intact and symmetric -- Sensation: intact and symmetric  Labs:  CBC Latest Ref Rng & Units 08/25/2017 08/24/2017 08/24/2017  WBC 3.8 - 10.6 K/uL 18.3(H) - 17.4(H)  Hemoglobin 13.0 - 18.0 g/dL 11.7(L) 11.7(L) 11.6(L)  Hematocrit 40.0 - 52.0 % 36.2(L) 36.2(L) 35.5(L)  Platelets 150 - 440 K/uL 401 - 398   CMP Latest Ref Rng & Units 08/25/2017 08/24/2017 08/14/2017  Glucose 65 - 99 mg/dL 248(H) 164(H) -  BUN  6 - 20 mg/dL 10 11 -  Creatinine 0.61 - 1.24 mg/dL 0.62 0.56(L) 0.45(L)  Sodium 135 - 145 mmol/L 129(L) 132(L) -  Potassium 3.5 - 5.1 mmol/L 3.9 3.6 -  Chloride 101 - 111 mmol/L 97(L) 99(L) -  CO2 22 - 32 mmol/L 26 27 -  Calcium 8.9 - 10.3 mg/dL 8.4(L) 8.7(L) -  Total Protein 6.5 - 8.1 g/dL - 6.8 -  Total Bilirubin 0.3 - 1.2 mg/dL - 0.5 -  Alkaline Phos 38 - 126 U/L - 74 -  AST 15 - 41 U/L - 21 -  ALT 17 - 63 U/L - 19 -   Lactate (08/24/2017): 0.9 (normal)   Imaging studies:  CT Abdomen and Pelvis with Contrast (08/24/2017) - personally reviewed and discussed with patient and medical physician Stomach within normal limits. No evidence for small bowel obstruction. Diffuse wall thickening with mucosal enhancement about the colon, consistent with acute colitis. Changes most notable at the splenic flexure. Associated pneumatosis involving the ascending and transverse colon. No gas within the SMV or portal vein. No other acute inflammatory changes about  the bowels.  Assessment/Plan: (ICD-10's: K92.2, K52.3) 56 y.o. male with acute lower GI bleed and suprapubic > LLQ abdominal pain associated with pancolitis worst at his splenic flexure with pneumatosis of the ascending and transverse colon -- concerning for diverticular, ischemic, and infectious etiologies, none of which are supported by workup thus far-- complicated by leukocytosis with normal lactic acid and pertinent comorbidities including DM type 1, antibiotics for recent listerial meningitis, hypothyroidism, and degenerative spine disease.   - IV antibiotics (consider Zosyn)  - no indication for emergent surgical intervention at this time, will follow  - NPO for now until it is clear patient will not need a procedure or intervention  - consider GI consultation regarding etiologies for pancolitis, possible colonoscopy  - trend Hb and WBC, transfuse prn, and monitor abdominal exam and bowel function  - medical management of comorbidities as per primary medical team  - DVT prophylaxis, ambulation encouraged  All of the above findings and recommendations were discussed with the patient and his medical physician, and all of patient's questions were answered to his expressed satisfaction.  Thank you for the opportunity to participate in this patient's care.   -- Marilynne Drivers Rosana Hoes, MD, [place]: [facility] - Partnering for exceptional care. Office: [phone]

## 2017-08-25 NOTE — Consult Note (Signed)
[facility] Infectious Disease     Reason for Consult:  Listeria  Referring Physician: Lance Coon Date of Admission:  08/24/2017   Principal Problem:   Colitis Active Problems:   Type 1 diabetes ([place])   Hypothyroidism   GI bleed   HPI: Carlos Mueller is a 56 y.o. male admitted from facility with abd pain and rectal bleeding, wbc of 17 but no fevers. Ct showed Colitis and pneumatosis coli. He was at a facility finishing treatment for listeria meningitis dx and treated at [facility].  He was admitted with seizures and HA.  Was found to have bacteremia and meningitis with listeria. Treated with ampicillin and gent (initialy 7 days).  Stop date for ampicillin was 10/24.    He has been seen by surgery. Cdiff and Stool PCR negative.  He continues with BRBPR. Reports some mild crampy abd pain. Had episode NV yesterday. No fevers. Continues with HA.  Past Medical History:  Diagnosis Date  . Hypothyroidism   . Lumbar vertebral fracture (HCC)   . 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 Type 1 diabetes Carlos Mueller)    Past Surgical History:  Procedure Laterality Date  . NO PAST SURGERIES     Social History  Substance Use Topics  . Smoking status: Never Smoker  . Smokeless tobacco: Never Used  . Alcohol use No   Family History  Problem Relation Age of Onset  . Hypertension Mother     Allergies: No Known Allergies  Current antibiotics: Antibiotics Given (last 72 hours)    Date/Time Action Medication Dose Rate   08/24/17 2358 New Bag/Given   piperacillin-tazobactam (ZOSYN) IVPB 3.375 g 3.375 g 100 mL/hr   08/25/17 0304 New Bag/Given   metroNIDAZOLE (FLAGYL) IVPB 500 mg 500 mg 100 mL/hr   08/25/17 0407 New Bag/Given   ciprofloxacin (CIPRO) IVPB 400 mg 400 mg 200 mL/hr   08/25/17 3354 New Bag/Given   metroNIDAZOLE (FLAGYL) IVPB 500 mg 500 mg 100 mL/hr      MEDICATIONS: . divalproex  500 mg Oral BID  . insulin aspart  0-9 Units Subcutaneous Q6H  . insulin detemir  15 Units Subcutaneous  Daily  . levothyroxine  125 mcg Oral QAC breakfast    Review of Systems - 11 systems reviewed and negative per HPI   OBJECTIVE: Temp:  [98.2 F (36.8 C)-98.4 F (36.9 C)] 98.2 F (36.8 C) (10/22 0841) Pulse Rate:  [65-96] 73 (10/22 0841) Resp:  [18-20] 18 (10/22 0156) BP: (145-167)/(56-85) 145/62 (10/22 0841) SpO2:  [97 %-100 %] 100 % (10/22 0841) Weight:  [49.4 kg (109 lb)-50.8 kg (112 lb 1.6 oz)] 50.8 kg (112 lb 1.6 oz) (10/22 0156) Physical Exam  Constitutional: He is oriented to person, place, and time. Thin, chronically ill appearing HENT: anicteric  Mouth/Throat: Oropharynx is clear and dry . No oropharyngeal exudate.  Cardiovascular: Normal rate, regular rhythm and normal heart sounds.  Pulmonary/Chest: Effort normal and breath sounds normal. No respiratory distress. He has no wheezes.  Abdominal: Soft. Bowel sounds are normal. He exhibits no distension. Mild ttp midline lower abd  Lymphadenopathy:  He has no cervical adenopathy.  Neurological: He is alert and oriented to person, place, and time.  Skin: Skin is warm and dry. No rash noted. No erythema.  Psychiatric: He has a normal mood and affect. His behavior is normal.     LABS: Results for orders placed or performed during the Mueller encounter of 08/24/17 (from the past 48 hour(s))  Comprehensive metabolic panel  Status: Abnormal   Collection Time: 08/24/17  8:03 PM  Result Value Ref Range   Sodium 132 (L) 135 - 145 mmol/L   Potassium 3.6 3.5 - 5.1 mmol/L   Chloride 99 (L) 101 - 111 mmol/L   CO2 27 22 - 32 mmol/L   Glucose, Bld 164 (H) 65 - 99 mg/dL   BUN 11 6 - 20 mg/dL   Creatinine, Ser 0.56 (L) 0.61 - 1.24 mg/dL   Calcium 8.7 (L) 8.9 - 10.3 mg/dL   Total Protein 6.8 6.5 - 8.1 g/dL   Albumin 3.1 (L) 3.5 - 5.0 g/dL   AST 21 15 - 41 U/L   ALT 19 17 - 63 U/L   Alkaline Phosphatase 74 38 - 126 U/L   Total Bilirubin 0.5 0.3 - 1.2 mg/dL   GFR calc non Af Amer >60 >60 mL/min   GFR calc Af Amer >60 >60  mL/min    Comment: (NOTE) The eGFR has been calculated using the CKD EPI equation. This calculation has not been validated in all clinical situations. eGFR's persistently <60 mL/min signify possible Chronic Kidney Disease.    Anion gap 6 5 - 15  CBC     Status: Abnormal   Collection Time: 08/24/17  8:03 PM  Result Value Ref Range   WBC 17.4 (H) 3.8 - 10.6 K/uL   RBC 4.35 (L) 4.40 - 5.90 MIL/uL   Hemoglobin 11.6 (L) 13.0 - 18.0 g/dL   HCT 35.5 (L) 40.0 - 52.0 %   MCV 81.7 80.0 - 100.0 fL   MCH 26.5 26.0 - 34.0 pg   MCHC 32.5 32.0 - 36.0 g/dL   RDW 20.9 (H) 11.5 - 14.5 %   Platelets 398 150 - 440 K/uL  Type and screen [place]     Status: None (Preliminary result)   Collection Time: 08/24/17  8:03 PM  Result Value Ref Range   ABO/RH(D) O POS    Antibody Screen NEG    Sample Expiration 08/27/2017    Unit Number [id number]    Blood Component Type RED CELLS,LR    Unit division 00    Status of Unit ALLOCATED    Transfusion Status OK TO TRANSFUSE    Crossmatch Result Compatible    Unit Number [id number]    Blood Component Type RED CELLS,LR    Unit division 00    Status of Unit ALLOCATED    Transfusion Status OK TO TRANSFUSE    Crossmatch Result Compatible   Lactic acid, plasma     Status: None   Collection Time: 08/24/17  8:03 PM  Result Value Ref Range   Lactic Acid, Venous 0.9 0.5 - 1.9 mmol/L  C difficile quick scan w PCR reflex     Status: None   Collection Time: 08/24/17  8:26 PM  Result Value Ref Range   C Diff antigen NEGATIVE NEGATIVE   C Diff toxin NEGATIVE NEGATIVE   C Diff interpretation No C. difficile detected.   Protime-INR     Status: None   Collection Time: 08/24/17  8:35 PM  Result Value Ref Range   Prothrombin Time 12.9 11.4 - 15.2 seconds   INR 0.98   APTT     Status: None   Collection Time: 08/24/17  8:35 PM  Result Value Ref Range   aPTT 30 24 - 36 seconds  Gastrointestinal Panel by PCR , Stool     Status: None    Collection Time: 08/24/17  8:35 PM  Result  Value Ref Range   Campylobacter species NOT DETECTED NOT DETECTED   Plesimonas shigelloides NOT DETECTED NOT DETECTED   Salmonella species NOT DETECTED NOT DETECTED   Yersinia enterocolitica NOT DETECTED NOT DETECTED   Vibrio species NOT DETECTED NOT DETECTED   Vibrio cholerae NOT DETECTED NOT DETECTED   Enteroaggregative E coli (EAEC) NOT DETECTED NOT DETECTED   Enteropathogenic E coli (EPEC) NOT DETECTED NOT DETECTED   Enterotoxigenic E coli (ETEC) NOT DETECTED NOT DETECTED   Shiga like toxin producing E coli (STEC) NOT DETECTED NOT DETECTED   Shigella/Enteroinvasive E coli (EIEC) NOT DETECTED NOT DETECTED   Cryptosporidium NOT DETECTED NOT DETECTED   Cyclospora cayetanensis NOT DETECTED NOT DETECTED   Entamoeba histolytica NOT DETECTED NOT DETECTED   Giardia lamblia NOT DETECTED NOT DETECTED   Adenovirus F40/41 NOT DETECTED NOT DETECTED   Astrovirus NOT DETECTED NOT DETECTED   Norovirus GI/GII NOT DETECTED NOT DETECTED   Rotavirus A NOT DETECTED NOT DETECTED   Sapovirus (I, II, IV, and V) NOT DETECTED NOT DETECTED  Prepare RBC     Status: None   Collection Time: 08/24/17  8:35 PM  Result Value Ref Range   Order Confirmation ORDER PROCESSED BY BLOOD BANK   Hemoglobin and hematocrit, blood     Status: Abnormal   Collection Time: 08/24/17 11:53 PM  Result Value Ref Range   Hemoglobin 11.7 (L) 13.0 - 18.0 g/dL   HCT 36.2 (L) 40.0 - 40.1 %  Basic metabolic panel     Status: Abnormal   Collection Time: 08/25/17  3:32 AM  Result Value Ref Range   Sodium 129 (L) 135 - 145 mmol/L   Potassium 3.9 3.5 - 5.1 mmol/L   Chloride 97 (L) 101 - 111 mmol/L   CO2 26 22 - 32 mmol/L   Glucose, Bld 248 (H) 65 - 99 mg/dL   BUN 10 6 - 20 mg/dL   Creatinine, Ser 0.62 0.61 - 1.24 mg/dL   Calcium 8.4 (L) 8.9 - 10.3 mg/dL   GFR calc non Af Amer >60 >60 mL/min   GFR calc Af Amer >60 >60 mL/min    Comment: (NOTE) The eGFR has been calculated using the CKD  EPI equation. This calculation has not been validated in all clinical situations. eGFR's persistently <60 mL/min signify possible Chronic Kidney Disease.    Anion gap 6 5 - 15  CBC     Status: Abnormal   Collection Time: 08/25/17  3:32 AM  Result Value Ref Range   WBC 18.3 (H) 3.8 - 10.6 K/uL   RBC 4.40 4.40 - 5.90 MIL/uL   Hemoglobin 11.7 (L) 13.0 - 18.0 g/dL   HCT 36.2 (L) 40.0 - 52.0 %   MCV 82.3 80.0 - 100.0 fL   MCH 26.6 26.0 - 34.0 pg   MCHC 32.3 32.0 - 36.0 g/dL   RDW 21.1 (H) 11.5 - 14.5 %   Platelets 401 150 - 440 K/uL  Glucose, capillary     Status: Abnormal   Collection Time: 08/25/17  5:48 AM  Result Value Ref Range   Glucose-Capillary 278 (H) 65 - 99 mg/dL   Comment 1 Notify RN   Glucose, capillary     Status: None   Collection Time: 08/25/17 11:58 AM  Result Value Ref Range   Glucose-Capillary 83 65 - 99 mg/dL   No components found for: ESR, C REACTIVE PROTEIN MICRO: Recent Results (from the past 720 hour(s))  C difficile quick scan w PCR reflex  Status: None   Collection Time: 08/24/17  8:26 PM  Result Value Ref Range Status   C Diff antigen NEGATIVE NEGATIVE Final   C Diff toxin NEGATIVE NEGATIVE Final   C Diff interpretation No C. difficile detected.  Final  Gastrointestinal Panel by PCR , Stool     Status: None   Collection Time: 08/24/17  8:35 PM  Result Value Ref Range Status   Campylobacter species NOT DETECTED NOT DETECTED Final   Plesimonas shigelloides NOT DETECTED NOT DETECTED Final   Salmonella species NOT DETECTED NOT DETECTED Final   Yersinia enterocolitica NOT DETECTED NOT DETECTED Final   Vibrio species NOT DETECTED NOT DETECTED Final   Vibrio cholerae NOT DETECTED NOT DETECTED Final   Enteroaggregative E coli (EAEC) NOT DETECTED NOT DETECTED Final   Enteropathogenic E coli (EPEC) NOT DETECTED NOT DETECTED Final   Enterotoxigenic E coli (ETEC) NOT DETECTED NOT DETECTED Final   Shiga like toxin producing E coli (STEC) NOT DETECTED NOT  DETECTED Final   Shigella/Enteroinvasive E coli (EIEC) NOT DETECTED NOT DETECTED Final   Cryptosporidium NOT DETECTED NOT DETECTED Final   Cyclospora cayetanensis NOT DETECTED NOT DETECTED Final   Entamoeba histolytica NOT DETECTED NOT DETECTED Final   Giardia lamblia NOT DETECTED NOT DETECTED Final   Adenovirus F40/41 NOT DETECTED NOT DETECTED Final   Astrovirus NOT DETECTED NOT DETECTED Final   Norovirus GI/GII NOT DETECTED NOT DETECTED Final   Rotavirus A NOT DETECTED NOT DETECTED Final   Sapovirus (I, II, IV, and V) NOT DETECTED NOT DETECTED Final    IMAGING: Ct Abdomen Pelvis W Contrast  Result Date: 08/24/2017 CLINICAL DATA:  Initial evaluation for acute left lower quadrant and periumbilical abdominal pain. EXAM: CT ABDOMEN AND PELVIS WITH CONTRAST TECHNIQUE: Multidetector CT imaging of the abdomen and pelvis was performed using the standard protocol following bolus administration of intravenous contrast. CONTRAST:  56m ISOVUE-300 IOPAMIDOL (ISOVUE-300) INJECTION 61% COMPARISON:  Prior CT from 06/30/2017. FINDINGS: Lower chest: Minimal subsegmental atelectasis present within the lung bases. Visualized lungs otherwise clear. Hepatobiliary: Subcentimeter hypodensity within the subcapsular left hepatic lobe noted, too small the characterize. Liver otherwise unremarkable. Gallbladder within normal limits. No biliary dilatation. Pancreas: Pancreas within normal limits. Spleen: Spleen within normal limits. Adrenals/Urinary Tract: The adrenal glands are normal. Kidneys equal in size with symmetric enhancement. No nephrolithiasis, hydronephrosis, or focal enhancing renal mass. The no hydroureter. Partially distended bladder within normal limits. Stomach/Bowel: Stomach within normal limits. No evidence for small bowel obstruction. Diffuse wall thickening with mucosal enhancement about the colon, consistent with acute colitis. Changes most notable at the splenic flexure. Associated pneumatosis  involving the ascending and transverse colon. No gas within the SMV or portal vein. No other acute inflammatory changes about the bowels. Vascular/Lymphatic: Mild aortic atherosclerosis. No aneurysm. Normal intravascular enhancement seen throughout the intra-abdominal aorta on. Aortic branch vessels appear patent proximally, with specific note made of the SMA and IMA. No adenopathy. Reproductive: Prostate normal. Other: Small volume free reactive fluid about the inflamed colon. No free intraperitoneal air. Musculoskeletal: Compression deformity involving the L4 vertebral body has progressed relative to prior CT from 06/30/2017, now measuring up to approximately 70% with 4 mm bony retropulsion. An acute component not excluded. No other acute osseous abnormality. No worrisome lytic or blastic osseous lesions. IMPRESSION: 1. Findings consistent with acute colitis, with greatest involvement at the descending and sigmoid colon. Associated pneumatosis within the ascending and transverse colon. Correlation with serum lactate recommended. No portal venous gas. 2. Further collapse  of the previously seen comminuted L4 vertebral fracture, now measuring up to 70% with 4 mm bony retropulsion. A superimposed acute component is not excluded. 3. No other acute abnormality within the abdomen and pelvis. Electronically Signed   By: Jeannine Boga M.D.   On: 08/24/2017 23:24    Assessment:   Carlos Mueller is a 56 y.o. male with recent Listeria meningitis and bacteremia who was at [facility] on IV ampicillin (having finished gent), now admitted with bloody diarrhea and leukocytosis. CT shows colitis and pneumatosis coli. He has been seen by surgery and is not felt to need surgery.  Differential is broad but so far neg for infectious etiology with neg C diff and neg Stool PCR.  Concern for ischemic colitis or other colonic cause.   Recommendations Change zosyn to unasyn - ampicillin is the drug of choice for Listeria and his  stop date was 10/24 Monitor wbc and fever curve.  Consider repeating C diff test as pt reports that his stool was largely blood on admission so if becomes more diarrhea like may be worth retesting as C diff would be an obvious etiology in the pt on IV abx with colitis and Leukocytosis  Thank you very much for allowing me to participate in the care of this patient. Please call with questions.   Cheral Marker. Ola Spurr, MD

## 2017-08-25 NOTE — H&P (Signed)
[place] [facility] [place] NAME: Carlos Mueller    MR#:  [id number]  DATE OF BIRTH:  04-10-1961  DATE OF ADMISSION:  08/24/2017  PRIMARY CARE PHYSICIAN: Patient, No Pcp Per   REQUESTING/REFERRING PHYSICIAN: Alfred Levins, MD  CHIEF COMPLAINT:   Chief Complaint  Patient presents with  . Abdominal Pain  . Rectal Bleeding    HISTORY OF PRESENT ILLNESS:  Carlos Mueller  is a 56 y.o. male who presents with abdominal pain for the past couple of days, increasing diarrhea, today's diarrhea became bloody. Here in the ED his had several episodes of bloody diarrhea. Workup shows leukocytosis, CT scan shows colitis. Hospitalists were called for admission  PAST MEDICAL HISTORY:   Past Medical History:  Diagnosis Date  . Hypothyroidism   . Lumbar vertebral fracture (HCC)   . 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 Type 1 diabetes ([place])     PAST SURGICAL HISTORY:   Past Surgical History:  Procedure Laterality Date  . NO PAST SURGERIES      SOCIAL HISTORY:   Social History  Substance Use Topics  . Smoking status: Never Smoker  . Smokeless tobacco: Never Used  . Alcohol use No    FAMILY HISTORY:   Family History  Problem Relation Age of Onset  . Hypertension Mother     DRUG ALLERGIES:  No Known Allergies  MEDICATIONS AT HOME:   Prior to Admission medications   Medication Sig Start Date End Date Taking? Authorizing Provider  ampicillin (OMNIPEN) 2 g injection Inject 2 g into the vein every 4 (four) hours.   Yes [provider]  calcium carbonate (OSCAL) 1500 (600 Ca) MG TABS tablet Take 600 mg by mouth daily with breakfast.   Yes [provider]  Cyanocobalamin 1000 MCG TBCR Take 1,000 mcg by mouth daily.   Yes [provider]  divalproex (DEPAKOTE) 500 MG DR tablet Take 500 mg by mouth 2 (two) times daily.   Yes [provider]  heparin flush 10 UNIT/ML SOLN injection Inject 50 Units into the vein 4  (four) times daily.   Yes [provider]  insulin aspart (NOVOLOG FLEXPEN) 100 UNIT/ML FlexPen Inject 0-5 Units into the skin 3 (three) times daily with meals. If blood sugar is less than 60, call MD If blood sugar is 200-250, give 1 unit If blood sugar is 251-300, give 2 unit If blood sugar is 301-350, give 3 unit If blood sugar is 351-400, give 4 unit If blood sugar is 401-450, give 5 unit If blood sugar is greater than 450, call MD   Yes [provider]  insulin detemir (LEVEMIR) 100 UNIT/ML injection Inject 0.08 mLs (8 Units total) into the skin 2 (two) times daily. Patient taking differently: Inject 18-21 Units into the skin 2 (two) times daily. 21 units in the morning and 18 units at bedtime 07/04/17  Yes Mody, Sital, MD  insulin lispro (HUMALOG) 100 UNIT/ML injection Inject 6 Units into the skin 3 (three) times daily before meals.   Yes [provider]  levothyroxine (SYNTHROID, LEVOTHROID) 125 MCG tablet TAKE ONE TABLET BY MOUTH EVERY MORNING BEFORE BREAKFAST] 06/24/17  Yes [provider]  Multiple Vitamin (MULTIVITAMIN WITH MINERALS) TABS tablet Take 1 tablet by mouth daily.   Yes [provider]  oxyCODONE (OXY IR/ROXICODONE) 5 MG immediate release tablet Take 5 mg by mouth every 4 (four) hours as needed for severe pain.   Yes [provider]  senna (SENOKOT) 8.6 MG TABS tablet Take 1 tablet by mouth daily.   Yes [provider]  sodium chloride 0.9 % injection Inject 5 mLs into the vein as directed. Flush with 5 ml before and after antibiotic   Yes [provider]  Vitamin D, Ergocalciferol, (DRISDOL) 50000 units CAPS capsule Take 50,000 Units by mouth every 7 (seven) days. Patient takes on Friday   Yes [provider]    REVIEW OF SYSTEMS:  Review of Systems  Constitutional: Negative for chills, fever, malaise/fatigue and weight loss.  HENT: Negative for ear pain, hearing loss and tinnitus.   Eyes:  Negative for blurred vision, double vision, pain and redness.  Respiratory: Negative for cough, hemoptysis and shortness of breath.   Cardiovascular: Negative for chest pain, palpitations, orthopnea and leg swelling.  Gastrointestinal: Positive for abdominal pain and diarrhea. Negative for constipation, nausea and vomiting.  Genitourinary: Negative for dysuria, frequency and hematuria.  Musculoskeletal: Negative for back pain, joint pain and neck pain.  Skin:       No acne, rash, or lesions  Neurological: Negative for dizziness, tremors, focal weakness and weakness.  Endo/Heme/Allergies: Negative for polydipsia. Does not bruise/bleed easily.  Psychiatric/Behavioral: Negative for depression. The patient is not nervous/anxious and does not have insomnia.      VITAL SIGNS:   Vitals:   08/24/17 1946 08/24/17 1953 08/24/17 2347  BP:  (!) 151/85 (!) 164/71  Pulse:  96 70  Resp:  20 18  Temp:  98.3 F (36.8 C)   TempSrc:  Oral   SpO2: 97% 98% 99%  Weight:  49.4 kg (109 lb)   Height:  5\' 8"  (1.727 m)    Wt Readings from Last 3 Encounters:  08/24/17 49.4 kg (109 lb)  06/28/17 59 kg (130 lb)    PHYSICAL EXAMINATION:  Physical Exam  Vitals reviewed. Constitutional: He is oriented to person, place, and time. He appears well-developed and well-nourished. No distress.  HENT:  Head: Normocephalic and atraumatic.  Mouth/Throat: Oropharynx is clear and moist.  Eyes: Pupils are equal, round, and reactive to light. Conjunctivae and EOM are normal. No scleral icterus.  Neck: Normal range of motion. Neck supple. No JVD present. No thyromegaly present.  Cardiovascular: Normal rate, regular rhythm and intact distal pulses.  Exam reveals no gallop and no friction rub.   No murmur heard. Respiratory: Effort normal and breath sounds normal. No respiratory distress. He has no wheezes. He has no rales.  GI: Soft. Bowel sounds are normal. He exhibits no distension. There is tenderness.   Musculoskeletal: Normal range of motion. He exhibits no edema.  No arthritis, no gout  Lymphadenopathy:    He has no cervical adenopathy.  Neurological: He is alert and oriented to person, place, and time. No cranial nerve deficit.  No dysarthria, no aphasia  Skin: Skin is warm and dry. No rash noted. No erythema.  Psychiatric: He has a normal mood and affect. His behavior is normal. Judgment and thought content normal.    LABORATORY PANEL:   CBC  Recent Labs Lab 08/24/17 2003 08/24/17 2353  WBC 17.4*  --   HGB 11.6* 11.7*  HCT 35.5* 36.2*  PLT 398  --    ------------------------------------------------------------------------------------------------------------------  Chemistries   Recent Labs Lab 08/24/17 2003  NA 132*  K 3.6  CL 99*  CO2 27  GLUCOSE 164*  BUN 11  CREATININE 0.56*  CALCIUM 8.7*  AST 21  ALT 19  ALKPHOS 74  BILITOT 0.5   ------------------------------------------------------------------------------------------------------------------  Cardiac Enzymes No results for input(s): TROPONINI in the last 168 hours. ------------------------------------------------------------------------------------------------------------------  RADIOLOGY:  Ct Abdomen Pelvis W Contrast  Result Date: 08/24/2017 CLINICAL DATA:  Initial evaluation for acute left lower quadrant and periumbilical abdominal pain. EXAM: CT ABDOMEN AND PELVIS WITH CONTRAST TECHNIQUE: Multidetector CT imaging of the abdomen and pelvis was performed using the standard protocol following bolus administration of intravenous contrast. CONTRAST:  70mL ISOVUE-300 IOPAMIDOL (ISOVUE-300) INJECTION 61% COMPARISON:  Prior CT from 06/30/2017. FINDINGS: Lower chest: Minimal subsegmental atelectasis present within the lung bases. Visualized lungs otherwise clear. Hepatobiliary: Subcentimeter hypodensity within the subcapsular left hepatic lobe noted, too small the characterize. Liver otherwise unremarkable.  Gallbladder within normal limits. No biliary dilatation. Pancreas: Pancreas within normal limits. Spleen: Spleen within normal limits. Adrenals/Urinary Tract: The adrenal glands are normal. Kidneys equal in size with symmetric enhancement. No nephrolithiasis, hydronephrosis, or focal enhancing renal mass. The no hydroureter. Partially distended bladder within normal limits. Stomach/Bowel: Stomach within normal limits. No evidence for small bowel obstruction. Diffuse wall thickening with mucosal enhancement about the colon, consistent with acute colitis. Changes most notable at the splenic flexure. Associated pneumatosis involving the ascending and transverse colon. No gas within the SMV or portal vein. No other acute inflammatory changes about the bowels. Vascular/Lymphatic: Mild aortic atherosclerosis. No aneurysm. Normal intravascular enhancement seen throughout the intra-abdominal aorta on. Aortic branch vessels appear patent proximally, with specific note made of the SMA and IMA. No adenopathy. Reproductive: Prostate normal. Other: Small volume free reactive fluid about the inflamed colon. No free intraperitoneal air. Musculoskeletal: Compression deformity involving the L4 vertebral body has progressed relative to prior CT from 06/30/2017, now measuring up to approximately 70% with 4 mm bony retropulsion. An acute component not excluded. No other acute osseous abnormality. No worrisome lytic or blastic osseous lesions. IMPRESSION: 1. Findings consistent with acute colitis, with greatest involvement at the descending and sigmoid colon. Associated pneumatosis within the ascending and transverse colon. Correlation with serum lactate recommended. No portal venous gas. 2. Further collapse of the previously seen comminuted L4 vertebral fracture, now measuring up to 70% with 4 mm bony retropulsion. A superimposed acute component is not excluded. 3. No other acute abnormality within the abdomen and pelvis.  Electronically Signed   By: Jeannine Boga M.D.   On: 08/24/2017 23:24    EKG:   Orders placed or performed during the hospital encounter of 06/28/17  . ED EKG  . ED EKG  . EKG 12-Lead  . EKG 12-Lead  . ED EKG  . ED EKG    IMPRESSION AND PLAN:  Principal Problem:   Colitis - C. difficile was negative, GI panel was negative, unclear etiology, IV antibiotics and place, CT scan showed some pneumatosis so surgery was consulted Active Problems:   GI bleed - patient has had a number of bowel movements with blood, initially was thought he was bleeding profusely, however his hemoglobin is stable and his seems now that he simply is having some very watery stools from the colitis with some bleeding in the stool. We will continue to monitor his hemoglobin, GI panel is C. difficile negative so Imodium ordered   Type 1 diabetes (HCC) - sliding scale insulin with corresponding glucose checks   Hypothyroidism - home dose thyroid replacement  All the records are reviewed and case discussed with ED provider. Management plans discussed with the patient and/or family.  DVT PROPHYLAXIS: Mechanical only  GI PROPHYLAXIS: None  ADMISSION STATUS: Inpatient  CODE STATUS: Full Code Status  History    Date Active Date Inactive Code Status Order ID Comments User Context   06/28/2017  3:38 PM 07/04/2017  7:33 PM Full Code [id number]  [name], MD Inpatient   06/28/2017  2:31 PM 06/28/2017  3:38 PM Full Code [id number]  [name], MD ED      TOTAL TIME TAKING CARE OF THIS PATIENT: 45 minutes.   Nare Gaspari [place] 08/25/2017, 12:46 AM  [facility]  Office  [phone]  CC: Primary care physician; Patient, No Pcp Per  Note:  This document was prepared using Dragon voice recognition software and may include unintentional dictation errors.

## 2017-08-25 NOTE — NC FL2 (Signed)
[place] LEVEL OF CARE SCREENING TOOL     IDENTIFICATION  Patient Name: Carlos Mueller Birthdate: November 14, 1960 Sex: male Admission Date (Current Location): 08/24/2017  [place] and [place] Number:  Engineering geologist and Address:  [facility], [address], [place], [place] [address]      Provider Number: [id number]  Attending Physician Name and Address:  [name], MD  Relative Name and Phone Number:       Current Level of Care: Hospital Recommended Level of Care: [place] Prior Approval Number:    Date Approved/Denied:   PASRR Number:  [id number] )  Discharge Plan: SNF    Current Diagnoses: Patient Active Problem List   Diagnosis Date Noted  . Colitis 08/25/2017  . Hypothyroidism 08/25/2017  . GI bleed 08/25/2017  . Type 1 diabetes ([place]) 06/30/2017  . Symptomatic bradycardia 06/30/2017  . Syncope 06/28/2017    Orientation RESPIRATION BLADDER Height & Weight     Self, Time, Situation, Place  Normal Continent Weight: 112 lb 1.6 oz (50.8 kg) Height:  5\' 8"  (172.7 cm)  BEHAVIORAL SYMPTOMS/MOOD NEUROLOGICAL BOWEL NUTRITION STATUS      Continent Diet (Diet: NPO to be advanced. )  AMBULATORY STATUS COMMUNICATION OF NEEDS Skin   Extensive Assist Verbally Normal                       Personal Care Assistance Level of Assistance  Bathing, Feeding, Dressing Bathing Assistance: Limited assistance Feeding assistance: Independent Dressing Assistance: Limited assistance     Functional Limitations Info  Sight, Hearing, Speech Sight Info: Adequate Hearing Info: Adequate Speech Info: Adequate    SPECIAL CARE FACTORS FREQUENCY  PT (By licensed PT), OT (By licensed OT)     PT Frequency:  (5) OT Frequency:  (5)            Contractures      Additional Factors Info  Code Status, Allergies Code Status Info:  (Full Code. ) Allergies Info:  (No Known Allergies. )           Current  Medications (08/25/2017):  This is the current hospital active medication list Current Facility-Administered Medications  Medication Dose Route Frequency Provider Last Rate Last Dose  . 0.9 %  sodium chloride infusion   Intravenous Continuous Lance Coon, MD 75 mL/hr at 08/25/17 0158    . acetaminophen (TYLENOL) tablet 650 mg  650 mg Oral Q6H PRN Lance Coon, MD       Or  . acetaminophen (TYLENOL) suppository 650 mg  650 mg Rectal Q6H PRN Lance Coon, MD      . ciprofloxacin (CIPRO) IVPB 400 mg  400 mg Intravenous Laurence Spates, MD   Stopped at 08/25/17 0507  . divalproex (DEPAKOTE) DR tablet 500 mg  500 mg Oral BID Lance Coon, MD   500 mg at 08/25/17 0841  . insulin aspart (novoLOG) injection 0-9 Units  0-9 Units Subcutaneous Q6H Lance Coon, MD   5 Units at 08/25/17 [phone]  . insulin detemir (LEVEMIR) injection 15 Units  15 Units Subcutaneous Daily Zaylie Gisler, MD      . levothyroxine (SYNTHROID, LEVOTHROID) tablet 125 mcg  125 mcg Oral QAC breakfast Lance Coon, MD   125 mcg at 08/25/17 0840  . metroNIDAZOLE (FLAGYL) IVPB 500 mg  500 mg Intravenous Driscilla Moats, MD   Stopped at 08/25/17 [phone]  . ondansetron (ZOFRAN) tablet 4 mg  4 mg Oral Q6H PRN Lance Coon,  MD       Or  . ondansetron (ZOFRAN) injection 4 mg  4 mg Intravenous Q6H PRN Lance Coon, MD      . oxyCODONE (Oxy IR/ROXICODONE) immediate release tablet 5 mg  5 mg Oral Q4H PRN Lance Coon, MD   5 mg at 08/25/17 5625     Discharge Medications: Please see discharge summary for a list of discharge medications.  Relevant Imaging Results:  Relevant Lab Results:   Additional Information  (SSN: [id number])  Sample, Veronia Beets, LCSW

## 2017-08-25 NOTE — Progress Notes (Signed)
ANTIBIOTIC CONSULT NOTE - INITIAL  Pharmacy Consult for Ciprofloxacin  Indication: intra abdominal infection  No Known Allergies  Patient Measurements: Height: 5\' 8"  (172.7 cm) Weight: 112 lb 1.6 oz (50.8 kg) IBW/kg (Calculated) : 68.4 Adjusted Body Weight:   Vital Signs: Temp: 98.4 F (36.9 C) (10/22 0156) Temp Source: Oral (10/22 0156) BP: 152/56 (10/22 0156) Pulse Rate: 65 (10/22 0156) Intake/Output from previous day: 10/21 0701 - 10/22 0700 In: 1236.3 [I.V.:86.3; IV Piggyback:1150] Out: -  Intake/Output from this shift: Total I/O In: 1236.3 [I.V.:86.3; IV Piggyback:1150] Out: -   Labs:  Recent Labs  08/24/17 2003 08/24/17 2353  WBC 17.4*  --   HGB 11.6* 11.7*  PLT 398  --   CREATININE 0.56*  --    Estimated Creatinine Clearance: 74.1 mL/min (A) (by C-G formula based on SCr of 0.56 mg/dL (L)). No results for input(s): VANCOTROUGH, VANCOPEAK, VANCORANDOM, GENTTROUGH, GENTPEAK, GENTRANDOM, TOBRATROUGH, TOBRAPEAK, TOBRARND, AMIKACINPEAK, AMIKACINTROU, AMIKACIN in the last 72 hours.   Microbiology: Recent Results (from the past 720 hour(s))  C difficile quick scan w PCR reflex     Status: None   Collection Time: 08/24/17  8:26 PM  Result Value Ref Range Status   C Diff antigen NEGATIVE NEGATIVE Final   C Diff toxin NEGATIVE NEGATIVE Final   C Diff interpretation No C. difficile detected.  Final  Gastrointestinal Panel by PCR , Stool     Status: None   Collection Time: 08/24/17  8:35 PM  Result Value Ref Range Status   Campylobacter species NOT DETECTED NOT DETECTED Final   Plesimonas shigelloides NOT DETECTED NOT DETECTED Final   Salmonella species NOT DETECTED NOT DETECTED Final   Yersinia enterocolitica NOT DETECTED NOT DETECTED Final   Vibrio species NOT DETECTED NOT DETECTED Final   Vibrio cholerae NOT DETECTED NOT DETECTED Final   Enteroaggregative E coli (EAEC) NOT DETECTED NOT DETECTED Final   Enteropathogenic E coli (EPEC) NOT DETECTED NOT DETECTED  Final   Enterotoxigenic E coli (ETEC) NOT DETECTED NOT DETECTED Final   Shiga like toxin producing E coli (STEC) NOT DETECTED NOT DETECTED Final   Shigella/Enteroinvasive E coli (EIEC) NOT DETECTED NOT DETECTED Final   Cryptosporidium NOT DETECTED NOT DETECTED Final   Cyclospora cayetanensis NOT DETECTED NOT DETECTED Final   Entamoeba histolytica NOT DETECTED NOT DETECTED Final   Giardia lamblia NOT DETECTED NOT DETECTED Final   Adenovirus F40/41 NOT DETECTED NOT DETECTED Final   Astrovirus NOT DETECTED NOT DETECTED Final   Norovirus GI/GII NOT DETECTED NOT DETECTED Final   Rotavirus A NOT DETECTED NOT DETECTED Final   Sapovirus (I, II, IV, and V) NOT DETECTED NOT DETECTED Final    Medical History: Past Medical History:  Diagnosis Date  . Hypothyroidism   . Lumbar vertebral fracture (HCC)   . 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 Type 1 diabetes (HCC)     Medications:  Prescriptions Prior to Admission  Medication Sig Dispense Refill Last Dose  . ampicillin (OMNIPEN) 2 g injection Inject 2 g into the vein every 4 (four) hours.   unknown at unknown  . calcium carbonate (OSCAL) 1500 (600 Ca) MG TABS tablet Take 600 mg by mouth daily with breakfast.   unknown at unknown  . Cyanocobalamin 1000 MCG TBCR Take 1,000 mcg by mouth daily.   unknown at unknown  . divalproex (DEPAKOTE) 500 MG DR tablet Take 500 mg by mouth 2 (two) times daily.   unknown at unknown  . heparin flush 10 UNIT/ML SOLN  injection Inject 50 Units into the vein 4 (four) times daily.   unknown at unknown  . insulin aspart (NOVOLOG FLEXPEN) 100 UNIT/ML FlexPen Inject 0-5 Units into the skin 3 (three) times daily with meals. If blood sugar is less than 60, call MD If blood sugar is 200-250, give 1 unit If blood sugar is 251-300, give 2 unit If blood sugar is 301-350, give 3 unit If blood sugar is 351-400, give 4 unit If blood sugar is 401-450, give 5 unit If blood sugar is greater than 450, call MD   unknown at unknown  .  insulin detemir (LEVEMIR) 100 UNIT/ML injection Inject 0.08 mLs (8 Units total) into the skin 2 (two) times daily. (Patient taking differently: Inject 18-21 Units into the skin 2 (two) times daily. 21 units in the morning and 18 units at bedtime) 10 mL 11 unknown at unknown  . insulin lispro (HUMALOG) 100 UNIT/ML injection Inject 6 Units into the skin 3 (three) times daily before meals.   unknown at unknown  . levothyroxine (SYNTHROID, LEVOTHROID) 125 MCG tablet TAKE ONE TABLET BY MOUTH EVERY MORNING BEFORE BREAKFAST]  0 unknown at unknown  . Multiple Vitamin (MULTIVITAMIN WITH MINERALS) TABS tablet Take 1 tablet by mouth daily.   unknown at unknown  . oxyCODONE (OXY IR/ROXICODONE) 5 MG immediate release tablet Take 5 mg by mouth every 4 (four) hours as needed for severe pain.   prn at prn  . senna (SENOKOT) 8.6 MG TABS tablet Take 1 tablet by mouth daily.   unknown at unknown  . sodium chloride 0.9 % injection Inject 5 mLs into the vein as directed. Flush with 5 ml before and after antibiotic   unknown at unknown  . Vitamin D, Ergocalciferol, (DRISDOL) 50000 units CAPS capsule Take 50,000 Units by mouth every 7 (seven) days. Patient takes on Friday   unknown at unknown   Assessment: CrCl = 74.1 ml/min   Goal of Therapy:  resolution of infection   Plan:  Expected duration 7 days with resolution of temperature and/or normalization of WBC   Ciprofloxacin 400 mg IV Q12H ordered to start on 10/22 @ 0300.   Ren Grasse D 08/25/2017,3:17 AM

## 2017-08-25 NOTE — Consult Note (Signed)
Carlos Lame, MD [facility]  [address], [place], [place] [address] Phone: [phone] Fax : [phone]  Consultation  Referring Provider:     Dr. Margaretmary Eddy Primary Care Physician:  Patient, No Pcp Per Primary Gastroenterologist:          Althia Forts Reason for Consultation:     Colitis  Date of Admission:  08/24/2017 Date of Consultation:  08/25/2017         HPI:   Carlos Mueller is a 56 y.o. male Who has a history of meningitis treated at [facility].  The patient reports that yesterday he started to have diarrhea which was bloody and abdominal pain.  The patient states his abdominal pain is in the lower abdomen.  The patient had a CT scan that showed him to have colitis throughout the colon with increased colitis around the descending and sigmoid colon.  Patient had stool studies that did not show any cause for his symptoms.  The patient also denies any nausea vomiting fevers or chills.  The patient reports that his abdominal pain is slightly better today and he has slightly less bloody diarrhea.  The patient was seen by surgery who did not think the patient needed surgery. There was some pneumatosis seen in the descending and transverse colon. The patient's hemoglobin on admission was 11.6 which was 11.1 this morning.  Past Medical History:  Diagnosis Date  . Hypothyroidism   . Lumbar vertebral fracture (HCC)   . 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 Type 1 diabetes [facility])     Past Surgical History:  Procedure Laterality Date  . NO PAST SURGERIES      Prior to Admission medications   Medication Sig Start Date End Date Taking? Authorizing Provider  ampicillin (OMNIPEN) 2 g injection Inject 2 g into the vein every 4 (four) hours.   Yes [provider]  calcium carbonate (OSCAL) 1500 (600 Ca) MG TABS tablet Take 600 mg by mouth daily with breakfast.   Yes [provider]  Cyanocobalamin 1000 MCG TBCR Take 1,000 mcg by mouth daily.   Yes [provider]  divalproex (DEPAKOTE) 500 MG DR tablet Take 500 mg by mouth 2 (two) times daily.   Yes [provider]  heparin flush 10 UNIT/ML SOLN injection Inject 50 Units into the vein 4 (four) times daily.   Yes [provider]  insulin aspart (NOVOLOG FLEXPEN) 100 UNIT/ML FlexPen Inject 0-5 Units into the skin 3 (three) times daily with meals. If blood sugar is less than 60, call MD If blood sugar is 200-250, give 1 unit If blood sugar is 251-300, give 2 unit If blood sugar is 301-350, give 3 unit If blood sugar is 351-400, give 4 unit If blood sugar is 401-450, give 5 unit If blood sugar is greater than 450, call MD   Yes [provider]  insulin detemir (LEVEMIR) 100 UNIT/ML injection Inject 0.08 mLs (8 Units total) into the skin 2 (two) times daily. Patient taking differently: Inject 18-21 Units into the skin 2 (two) times daily. 21 units in the morning and 18 units at bedtime 07/04/17  Yes Mody, Sital, MD  insulin lispro (HUMALOG) 100 UNIT/ML injection Inject 6 Units into the skin 3 (three) times daily before meals.   Yes [provider]  levothyroxine (SYNTHROID, LEVOTHROID) 125 MCG tablet TAKE ONE TABLET BY MOUTH EVERY MORNING BEFORE BREAKFAST] 06/24/17  Yes [provider]  Multiple Vitamin (MULTIVITAMIN WITH MINERALS) TABS tablet  Take 1 tablet by mouth daily.   Yes [provider]  oxyCODONE (OXY IR/ROXICODONE) 5 MG immediate release tablet Take 5 mg by mouth every 4 (four) hours as needed for severe pain.   Yes [provider]  senna (SENOKOT) 8.6 MG TABS tablet Take 1 tablet by mouth daily.   Yes [provider]  sodium chloride 0.9 % injection Inject 5 mLs into the vein as directed. Flush with 5 ml before and after antibiotic   Yes [provider]  Vitamin D, Ergocalciferol, (DRISDOL) 50000 units CAPS capsule Take 50,000 Units by mouth every 7 (seven) days. Patient takes on Friday   Yes [provider]    Family History  Problem Relation Age of Onset  . Hypertension Mother      Social History  Substance Use Topics  . Smoking status: Never Smoker  . Smokeless tobacco: Never Used  . Alcohol use No    Allergies as of 08/24/2017  . (No Known Allergies)    Review of Systems:    All systems reviewed and negative except where noted in HPI.   Physical Exam:  Vital signs in last 24 hours: Temp:  [98.2 F (36.8 C)-98.7 F (37.1 C)] 98.7 F (37.1 C) (10/22 1644) Pulse Rate:  [65-96] 82 (10/22 1644) Resp:  [18-20] 18 (10/22 0156) BP: (144-167)/(56-85) 144/66 (10/22 1644) SpO2:  [97 %-100 %] 100 % (10/22 1644) Weight:  [109 lb (49.4 kg)-112 lb 1.6 oz (50.8 kg)] 112 lb 1.6 oz (50.8 kg) (10/22 0156) Last BM Date: 08/25/17 General:   Pleasant, cooperative in NAD Head:  Normocephalic and atraumatic. Eyes:   No icterus.   Conjunctiva pink. PERRLA. Ears:  Normal auditory acuity. Neck:  Supple; no masses or thyroidomegaly Lungs: Respirations even and unlabored. Lungs clear to auscultation bilaterally.   No wheezes, crackles, or rhonchi.  Heart:  Regular rate and rhythm;  Without murmur, clicks, rubs or gallops Abdomen:  Soft, nondistended, Diffusely tender. Normal bowel sounds. No appreciable masses or hepatomegaly.  No rebound or guarding.  Rectal:  Not performed. Msk:  Symmetrical without gross deformities.    Extremities:  Without edema, cyanosis or clubbing. Neurologic:  Alert and oriented x3;  grossly normal neurologically. Skin:  Intact without significant lesions or rashes. Cervical Nodes:  No significant cervical adenopathy. Psych:  Alert and cooperative. Normal affect.  LAB RESULTS:  Recent Labs  08/24/17 2003 08/24/17 2353 08/25/17 0332 08/25/17 1530  WBC 17.4*  --  18.3*  --   HGB 11.6* 11.7* 11.7* 11.1*  HCT 35.5* 36.2* 36.2* 34.3*  PLT 398  --  401  --    BMET  Recent Labs  08/24/17 2003 08/25/17 0332  NA 132* 129*  K 3.6 3.9  CL 99*  97*  CO2 27 26  GLUCOSE 164* 248*  BUN 11 10  CREATININE 0.56* 0.62  CALCIUM 8.7* 8.4*   LFT  Recent Labs  08/24/17 2003  PROT 6.8  ALBUMIN 3.1*  AST 21  ALT 19  ALKPHOS 74  BILITOT 0.5   PT/INR  Recent Labs  08/24/17 2035  LABPROT 12.9  INR 0.98    STUDIES: Ct Abdomen Pelvis W Contrast  Result Date: 08/24/2017 CLINICAL DATA:  Initial evaluation for acute left lower quadrant and periumbilical abdominal pain. EXAM: CT ABDOMEN AND PELVIS WITH CONTRAST TECHNIQUE: Multidetector CT imaging of the abdomen and pelvis was performed using the standard protocol following bolus administration of intravenous contrast. CONTRAST:  50mL ISOVUE-300 IOPAMIDOL (ISOVUE-300) INJECTION 61%  COMPARISON:  Prior CT from 06/30/2017. FINDINGS: Lower chest: Minimal subsegmental atelectasis present within the lung bases. Visualized lungs otherwise clear. Hepatobiliary: Subcentimeter hypodensity within the subcapsular left hepatic lobe noted, too small the characterize. Liver otherwise unremarkable. Gallbladder within normal limits. No biliary dilatation. Pancreas: Pancreas within normal limits. Spleen: Spleen within normal limits. Adrenals/Urinary Tract: The adrenal glands are normal. Kidneys equal in size with symmetric enhancement. No nephrolithiasis, hydronephrosis, or focal enhancing renal mass. The no hydroureter. Partially distended bladder within normal limits. Stomach/Bowel: Stomach within normal limits. No evidence for small bowel obstruction. Diffuse wall thickening with mucosal enhancement about the colon, consistent with acute colitis. Changes most notable at the splenic flexure. Associated pneumatosis involving the ascending and transverse colon. No gas within the SMV or portal vein. No other acute inflammatory changes about the bowels. Vascular/Lymphatic: Mild aortic atherosclerosis. No aneurysm. Normal intravascular enhancement seen throughout the intra-abdominal aorta on. Aortic branch vessels  appear patent proximally, with specific note made of the SMA and IMA. No adenopathy. Reproductive: Prostate normal. Other: Small volume free reactive fluid about the inflamed colon. No free intraperitoneal air. Musculoskeletal: Compression deformity involving the L4 vertebral body has progressed relative to prior CT from 06/30/2017, now measuring up to approximately 70% with 4 mm bony retropulsion. An acute component not excluded. No other acute osseous abnormality. No worrisome lytic or blastic osseous lesions. IMPRESSION: 1. Findings consistent with acute colitis, with greatest involvement at the descending and sigmoid colon. Associated pneumatosis within the ascending and transverse colon. Correlation with serum lactate recommended. No portal venous gas. 2. Further collapse of the previously seen comminuted L4 vertebral fracture, now measuring up to 70% with 4 mm bony retropulsion. A superimposed acute component is not excluded. 3. No other acute abnormality within the abdomen and pelvis. Electronically Signed   By: Jeannine Boga M.D.   On: 08/24/2017 23:24      Impression / Plan:   LEDELL CODRINGTON is a 56 y.o. y/o male with Pancolitis with crampy abdominal pain and bloody diarrhea.  The symptoms are consistent with ischemic colitis but with the lack of hypotension or sepsis this is less likely.  It is unlikely that this patient has developed a bowel disease at this time.  His stool tests were negative although he may be partially treated with antibiotics.  I would continue antibiotics and would hold off on any endoscopic procedures at this time in this patient who has diffuse colitis and pneumatosis.  The risk of perforation is higher in this patient and would refrain from doing any endoscopies unless urgently needed.   Thank you for involving me in the care of this patient.      LOS: 0 days   Carlos Lame, MD  08/25/2017, 5:58 PM   Note: This dictation was prepared with Dragon dictation  along with smaller phrase technology. Any transcriptional errors that result from this process are unintentional.

## 2017-08-25 NOTE — Progress Notes (Signed)
Initial Nutrition Assessment  DOCUMENTATION CODES:   Underweight  INTERVENTION:  1. Monitor for needs with diet advancement given GI bleed  NUTRITION DIAGNOSIS:   Inadequate oral intake related to acute illness, poor appetite as evidenced by per patient/family report  GOAL:   Patient will meet greater than or equal to 90% of their needs  MONITOR:   PO intake, I & O's, Labs, Weight trends, Skin  REASON FOR ASSESSMENT:   Low Braden    ASSESSMENT:   Carlos Mueller is a 56 yo male with PMH of abd pain, diarrhea, T1DM, now presents with bloody diarrhea, colitis, GI Bleed.  Spoke with family, patient at bedside. 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 11 pound/9% severe weight loss over 2 weeks. He was at [facility] eating well per family until 2 days ago. Patient has not wanted anything since then. Normally eats 3 meals per day there, meals made for patients. Having abdominal pain, needs to go to the bathroom every 5-10 mins per RN and Nurse Techs. No nausea/vomiting however. Currently NPO for possible surgical interventions.   Nutrition-Focused physical exam completed. Findings are no fat depletion, no muscle depletion, and no edema.   Labs reviewed:  CBG 278 Medications reviewed and include:  Novolog 0-9 units every 6 hours, Levemir 15 units NS at 67mL/hr  Diet Order:  Diet NPO time specified Except for: Sips with Meds  Skin:  Reviewed, no issues  Last BM:  08/25/2017 (type 7)  Height:   Ht Readings from Last 1 Encounters:  08/24/17 5\' 8"  (1.727 m)    Weight:   Wt Readings from Last 1 Encounters:  08/25/17 112 lb 1.6 oz (50.8 kg)    Ideal Body Weight:  70 kg  BMI:  Body mass index is 17.04 kg/m.  Estimated Nutritional Needs:   Kcal:  1600-1850 calories (MSJ x1.2-1.4)  Protein:  76-87 grams  Fluid:  1.6-1.9L  EDUCATION NEEDS:   Education needs no appropriate at this  time  Satira Anis. Carlos Augustus, MS, RD LDN Inpatient Clinical Dietitian Pager [phone]

## 2017-08-25 NOTE — ED Notes (Signed)
Pt transported to room 136

## 2017-08-25 NOTE — Progress Notes (Signed)
Inpatient Diabetes Program Recommendations  AACE/ADA: New Consensus Statement on Inpatient Glycemic Control (2015)  Target Ranges:  Prepandial:   less than 140 mg/dL      [facility] postprandial:   less than 180 mg/dL (1-2 hours)      Critically ill patients:  140 - 180 mg/dL  Results for MESSIAH, AHR (MRN [id number]) as of 08/25/2017 08:10  Ref. Range 08/25/2017 05:48  Glucose-Capillary Latest Ref Range: 65 - 99 mg/dL 278 (H)   Results for DQUAN, CORTOPASSI (MRN [id number]) as of 08/25/2017 08:10  Ref. Range 08/24/2017 20:03 08/25/2017 03:32  Glucose Latest Ref Range: 65 - 99 mg/dL 164 (H) 248 (H)    Review of Glycemic Control  Diabetes history: DM81 (dx at 56 years old; makes no insulin and will require basal, correction, and meal coverage insulin) Outpatient Diabetes medications: Levemir 21 units QAM, Levemir 18 units QPM, Novolog 0-5 units TID with meals Current orders for Inpatient glycemic control: Novolog 0-9 units Q6H  Inpatient Diabetes Program Recommendations: Insulin - Basal: Please consider ordering Levemir 15 units Q24H starting now (based on 50.8 kg x 0.3 units). HgbA1C: Last A1C in the chart was 10.8% on 06/29/17 indicating an average glucose of 263 mg/dl.  NOTE: Patient has Type 1 diabetes (dx at 56 years old) and therefore will require basal, correction, and meal coverage insulin. Patient was inpatient from 06/28/17 to 07/04/17 and during that hospitalization patient was seen by J. Montpellier, Therapist, sports, Dealer. Patient is followed by Dr. [name] (Endocrinologist) and was last seen on 08/21/17. Per Dr. Sammuel Hines office note dated 08/21/17, patient is prescribed Levemir 15u bid; Novolog to 12 units with breakfast,6 units with lunch,8 units with supper; and correction of 1 unit for 60 mg/dl over 200 mg/dl.   Thanks, Barnie Alderman, RN, MSN, CDE Diabetes Coordinator Inpatient Diabetes Program [phone] (Team Pager from 8am to 5pm)

## 2017-08-25 NOTE — Progress Notes (Signed)
Frequent lose stool. MD notified. Order received for immodium and cab modified diet

## 2017-08-25 NOTE — Progress Notes (Signed)
[facility] NAME: Carlos Mueller    MR#:  [id number]  DATE OF BIRTH:  1961-03-06  SUBJECTIVE:  CHIEF COMPLAINT:  Patient is reporting lower abdominal cramping and diarrhea with blood Sister and brother-in-law are at bedside. 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  EYES: No blurred or double vision.  EARS, NOSE, AND THROAT: No tinnitus or ear pain.  RESPIRATORY: No cough, shortness of breath, wheezing or hemoptysis.  CARDIOVASCULAR: No chest pain, orthopnea, edema.  GASTROINTESTINAL: Reporting lower abdominal cramps and GI bleeding with each bowel movement No nausea, vomiting, GENITOURINARY: No dysuria, hematuria.  ENDOCRINE: No polyuria, nocturia,  HEMATOLOGY: No anemia, easy bruising or bleeding SKIN: No rash or lesion. MUSCULOSKELETAL: No joint pain or arthritis.   NEUROLOGIC: No tingling, numbness, weakness.  PSYCHIATRY: No anxiety or depression.   DRUG ALLERGIES:  No Known Allergies  VITALS:  Blood pressure (!) 145/62, pulse 73, temperature 98.2 F (36.8 C), temperature source Oral, resp. rate 18, height 5\' 8"  (1.727 m), weight 50.8 kg (112 lb 1.6 oz), SpO2 100 %.  PHYSICAL EXAMINATION:  GENERAL:  56 y.o.-year-old patient lying in the bed with no acute distress.  EYES: Pupils equal, round, reactive to light and accommodation. No scleral icterus. Extraocular muscles intact.  HEENT: Head atraumatic, normocephalic. Oropharynx and nasopharynx clear.  NECK:  Supple, no jugular venous distention. No thyroid enlargement, no tenderness.  LUNGS: Normal breath sounds bilaterally, no wheezing, rales,rhonchi or crepitation. No use of accessory muscles of respiration.  CARDIOVASCULAR: S1, S2 normal. No murmurs, rubs, or gallops.  ABDOMEN: Soft, lower abdomen is tender, no rebound tenderness  EXTREMITIES: No  pedal edema, cyanosis, or clubbing.  NEUROLOGIC: Cranial nerves II through XII are intact. Muscle strength 5/5 in all extremities. Sensation intact. Gait not checked.  PSYCHIATRIC: The patient is alert and oriented x 3.  SKIN: No obvious rash, lesion, or ulcer.    LABORATORY PANEL:   CBC  Recent Labs Lab 08/25/17 0332  WBC 18.3*  HGB 11.7*  HCT 36.2*  PLT 401   ------------------------------------------------------------------------------------------------------------------  Chemistries   Recent Labs Lab 08/24/17 2003 08/25/17 0332  NA 132* 129*  K 3.6 3.9  CL 99* 97*  CO2 27 26  GLUCOSE 164* 248*  BUN 11 10  CREATININE 0.56* 0.62  CALCIUM 8.7* 8.4*  AST 21  --   ALT 19  --   ALKPHOS 74  --   BILITOT 0.5  --    ------------------------------------------------------------------------------------------------------------------  Cardiac Enzymes No results for input(s): TROPONINI in the last 168 hours. ------------------------------------------------------------------------------------------------------------------  RADIOLOGY:  Ct Abdomen Pelvis W Contrast  Result Date: 08/24/2017 CLINICAL DATA:  Initial evaluation for acute left lower quadrant and periumbilical abdominal pain. EXAM: CT ABDOMEN AND PELVIS WITH CONTRAST TECHNIQUE: Multidetector CT imaging of the abdomen and pelvis was performed using the standard protocol following bolus administration of intravenous contrast. CONTRAST:  56mL ISOVUE-300 IOPAMIDOL (ISOVUE-300) INJECTION 61% COMPARISON:  Prior CT from 06/30/2017. FINDINGS: Lower chest: Minimal subsegmental atelectasis present within the lung bases. Visualized lungs otherwise clear. Hepatobiliary: Subcentimeter hypodensity within the subcapsular left hepatic lobe noted, too small the characterize. Liver otherwise unremarkable. Gallbladder within normal limits. No biliary dilatation. Pancreas: Pancreas within normal limits. Spleen: Spleen within normal limits.  Adrenals/Urinary Tract: The adrenal glands are normal. Kidneys equal in size with symmetric enhancement. No nephrolithiasis, hydronephrosis, or focal enhancing  renal mass. The no hydroureter. Partially distended bladder within normal limits. Stomach/Bowel: Stomach within normal limits. No evidence for small bowel obstruction. Diffuse wall thickening with mucosal enhancement about the colon, consistent with acute colitis. Changes most notable at the splenic flexure. Associated pneumatosis involving the ascending and transverse colon. No gas within the SMV or portal vein. No other acute inflammatory changes about the bowels. Vascular/Lymphatic: Mild aortic atherosclerosis. No aneurysm. Normal intravascular enhancement seen throughout the intra-abdominal aorta on. Aortic branch vessels appear patent proximally, with specific note made of the SMA and IMA. No adenopathy. Reproductive: Prostate normal. Other: Small volume free reactive fluid about the inflamed colon. No free intraperitoneal air. Musculoskeletal: Compression deformity involving the L4 vertebral body has progressed relative to prior CT from 06/30/2017, now measuring up to approximately 70% with 4 mm bony retropulsion. An acute component not excluded. No other acute osseous abnormality. No worrisome lytic or blastic osseous lesions. IMPRESSION: 1. Findings consistent with acute colitis, with greatest involvement at the descending and sigmoid colon. Associated pneumatosis within the ascending and transverse colon. Correlation with serum lactate recommended. No portal venous gas. 2. Further collapse of the previously seen comminuted L4 vertebral fracture, now measuring up to 70% with 4 mm bony retropulsion. A superimposed acute component is not excluded. 3. No other acute abnormality within the abdomen and pelvis. Electronically Signed   By: Jeannine Boga M.D.   On: 08/24/2017 23:24    EKG:   Orders placed or performed during the hospital encounter  of 06/28/17  . ED EKG  . ED EKG  . EKG 12-Lead  . EKG 12-Lead  . ED EKG  . ED EKG    ASSESSMENT AND PLAN:     #Acute Colitis -unclear etiology C. difficile was negative, GI panel was negative  IV antibiotics Zosyn is changed to Unasyn  CT scan showed some pneumatosis so surgery was consulted, recommending IV antibiotics and no surgical interventions at this time Continue nothing by mouth  #Recent history of listeria meningitis Patient is on IV ampicillin until this Thursday; changed antibiotics to IV Unasyn after discussing with infectious disease which would cover both listeria meningitis and acute colitis  #GI bleed - from acute colitis  Monitor hemoglobin and hematocrit closely and nothing by mouth  patient has had a number of bowel movements with blood, initially was thought he was bleeding profusely, however his hemoglobin is stable GI is consulted  #Hyponatremia Hydrate with IV fluids and monitor renal function closely Monitor serial sodiums Patient is mentating fine  #  Type 1 diabetes ([facility]) - sliding scale insulin with corresponding glucose checks    # Hypothyroidism - home dose thyroid replacement    All the records are reviewed and case discussed with Care Management/Social Workerr. Management plans discussed with the patient, family and they are in agreement.  CODE STATUS: fc   TOTAL TIME TAKING CARE OF THIS PATIENT: 36 minutes.   POSSIBLE D/C IN 3  DAYS, DEPENDING ON CLINICAL CONDITION.  Note: This dictation was prepared with Dragon dictation along with smaller phrase technology. Any transcriptional errors that result from this process are unintentional.   [name] M.D on 08/25/2017 at 3:07 PM  Between 7am to 6pm - Pager - [phone] After 6pm go to [email or web address] - password [email or web address] [facility]  Office  [phone]  CC: Primary care physician; Patient, No Pcp Per

## 2017-08-26 LAB — BASIC METABOLIC PANEL
Anion gap: 8 (ref 5–15)
BUN: 7 mg/dL (ref 6–20)
CO2: 27 mmol/L (ref 22–32)
Calcium: 8.1 mg/dL — ABNORMAL LOW (ref 8.9–10.3)
Chloride: 97 mmol/L — ABNORMAL LOW (ref 101–111)
Creatinine, Ser: 0.66 mg/dL (ref 0.61–1.24)
GFR calc Af Amer: >60 mL/min (ref >60)
GFR calc non Af Amer: >60 mL/min (ref >60)
GLUCOSE: 192 mg/dL — AB (ref 65–99)
POTASSIUM: 3.7 mmol/L (ref 3.5–5.1)
Sodium: 132 mmol/L — ABNORMAL LOW (ref 135–145)

## 2017-08-26 LAB — GLUCOSE, CAPILLARY
GLUCOSE-CAPILLARY: 241 mg/dL — AB (ref 65–99)
GLUCOSE-CAPILLARY: 118 mg/dL — AB (ref 65–99)
Glucose-Capillary: 118 mg/dL — ABNORMAL HIGH (ref 65–99)
Glucose-Capillary: 56 mg/dL — ABNORMAL LOW (ref 65–99)
Glucose-Capillary: 98 mg/dL (ref 65–99)

## 2017-08-26 LAB — CBC
HEMATOCRIT: 32.6 % — AB (ref 40.0–52.0)
HEMOGLOBIN: 10.6 g/dL — AB (ref 13.0–18.0)
MCH: 26.4 pg (ref 26.0–34.0)
MCHC: 32.3 g/dL (ref 32.0–36.0)
MCV: 81.7 fL (ref 80.0–100.0)
Platelets: 366 10*3/uL (ref 150–440)
RBC: 3.99 MIL/uL — ABNORMAL LOW (ref 4.40–5.90)
RDW: 20.7 % — AB (ref 11.5–14.5)
WBC: 19 10*3/uL — ABNORMAL HIGH (ref 3.8–10.6)

## 2017-08-26 LAB — HEMOGLOBIN AND HEMATOCRIT, BLOOD
HEMATOCRIT: 29.8 % — AB (ref 40.0–52.0)
HEMOGLOBIN: 9.6 g/dL — AB (ref 13.0–18.0)

## 2017-08-26 MED ORDER — TRAMADOL HCL 50 MG PO TABS
50.0000 mg | ORAL_TABLET | Freq: Four times a day (QID) | ORAL | Status: DC | PRN
  Administered 2017-08-28: 50 mg via ORAL
  Filled 2017-08-26: qty 1

## 2017-08-26 MED ORDER — DEXTROSE 50 % IV SOLN
INTRAVENOUS | Status: AC
  Administered 2017-08-26: 25 mL
  Filled 2017-08-26: qty 50

## 2017-08-26 MED ORDER — MENTHOL 3 MG MT LOZG
1.0000 | LOZENGE | OROMUCOSAL | Status: DC | PRN
  Filled 2017-08-26: qty 9

## 2017-08-26 NOTE — Progress Notes (Addendum)
SURGICAL PROGRESS NOTE (cpt [phone])  Hospital Day(s): 1.   Post op day(s):  Marland Kitchen   Interval History: Patient seen and examined, no acute events or new complaints overnight, reports a single episode of crampy abdominal pain at 2 am relieved with pain medication and starting to recur while obtaining history this morning. Patient otherwise continues to report small loose BM's with blood, denies N/V, fever/chills, CP, SOB, or lightheadedness/dizziness. Patient again denies any preceding ingestion of anything abnormal or any profuse emesis or diarrhea to precipitate profound dehydration. He also denies smoking or any history of CAD, PAD, or post-prandial abdominal pain to suggest any history of arterial insufficiency.  Review of Systems:  Constitutional: denies fever, chills  HEENT: denies cough or congestion  Respiratory: denies any shortness of breath  Cardiovascular: denies chest pain or palpitations  Gastrointestinal: abdominal pain, N/V, and bowel function as per interval history Genitourinary: denies burning with urination or urinary frequency Musculoskeletal: denies pain, decreased motor or sensation Integumentary: denies any other rashes or skin discolorations Neurological: denies HA or vision/hearing changes   Vital signs in last 24 hours: [min-max] current  Temp:  [98.2 F (36.8 C)-98.9 F (37.2 C)] 98.3 F (36.8 C) (10/23 0556) Pulse Rate:  [73-96] 88 (10/23 0556) Resp:  [18] 18 (10/23 0556) BP: (141-145)/(51-69) 142/69 (10/23 0556) SpO2:  [98 %-100 %] 98 % (10/23 0556)     Height: 5\' 8"  (172.7 cm) Weight: 112 lb 1.6 oz (50.8 kg) BMI (Calculated): 17.05   Intake/Output this shift:  No intake/output data recorded.   Intake/Output last 2 shifts:  @IOLAST2SHIFTS @   Physical Exam:  Constitutional: alert, cooperative and no distress  HENT: normocephalic without obvious abnormality  Eyes: PERRL, EOM's grossly intact and symmetric  Neuro: CN II - XII grossly intact and symmetric  without deficit  Respiratory: breathing non-labored at rest  Cardiovascular: regular rate and sinus rhythm  Gastrointestinal: Abdomen soft and non-distended with moderate suprapubic tenderness to palpation > mild LLQ tenderness to palpation, no generalized peritonitis, guarding, or rebound tenderness Musculoskeletal: UE and LE FROM, no edema or wounds, motor and sensation grossly intact, NT   Labs:  CBC Latest Ref Rng & Units 08/26/2017 08/25/2017 08/25/2017  WBC 3.8 - 10.6 K/uL 19.0(H) - 18.3(H)  Hemoglobin 13.0 - 18.0 g/dL 10.6(L) 11.1(L) 11.7(L)  Hematocrit 40.0 - 52.0 % 32.6(L) 34.3(L) 36.2(L)  Platelets 150 - 440 K/uL 366 - 401   CMP Latest Ref Rng & Units 08/26/2017 08/25/2017 08/24/2017  Glucose 65 - 99 mg/dL 192(H) 248(H) 164(H)  BUN 6 - 20 mg/dL 7 10 11   Creatinine 0.61 - 1.24 mg/dL 0.66 0.62 0.56(L)  Sodium 135 - 145 mmol/L 132(L) 129(L) 132(L)  Potassium 3.5 - 5.1 mmol/L 3.7 3.9 3.6  Chloride 101 - 111 mmol/L 97(L) 97(L) 99(L)  CO2 22 - 32 mmol/L 27 26 27   Calcium 8.9 - 10.3 mg/dL 8.1(L) 8.4(L) 8.7(L)  Total Protein 6.5 - 8.1 g/dL - - 6.8  Total Bilirubin 0.3 - 1.2 mg/dL - - 0.5  Alkaline Phos 38 - 126 U/L - - 74  AST 15 - 41 U/L - - 21  ALT 17 - 63 U/L - - 19   Imaging studies: No new pertinent imaging studies   Assessment/Plan: (ICD-10's: K92.2, K52.3) 56 y.o. male with suprapubic > LLQ abdominal pain and acute lower GI bleed associated likely attributable to mucosal sloughing with pancolitis worst at his splenic flexure and pneumatosis of the ascending and transverse colon -- concerning for ulcerative colitis (IBD) vs  ischemia vs infectious colitis vs less likely diverticular or toxic/allergic exposure etiologies, none of which are fully consistent with or supported by workup thus far -- complicated by essentially stable leukocytosis but normal lactic acid and by pertinent comorbidities including DM type 1, antibiotics for recent listerial meningitis, hypothyroidism, and  degenerative spine disease.   - avoid hypotension             - IV antibiotics as per primary medical team  - NPO for now as discussed with MD and patient             - no indication for emergent surgical intervention at this time, will follow closely             - GI consultation appreciated regarding etiologies for pancolitis without hypotension or mesentery arterial disease and lactate WNL             - trend Hb and WBC, transfuse prn, and monitor abdominal exam and bowel function             - medical management of comorbidities as per primary medical team             - DVT prophylaxis, ambulation encouraged  All of the above findings and recommendations were discussed with the patient, his sister bedside, and his medical physician, and all of patient's and family's questions were answered to their expressed satisfaction.  Thank you for the opportunity to participate in this patient's care.   -- Marilynne Drivers Rosana Hoes, MD, [place]: [facility] - Partnering for exceptional care. Office: [phone]

## 2017-08-26 NOTE — Progress Notes (Signed)
[facility] [place] NAME: Carlos Mueller    MR#:  [id number]  DATE OF BIRTH:  02-Apr-1961  SUBJECTIVE:  CHIEF COMPLAINT:  Patient is reporting lower abdominal pain is better , sorethroat     REVIEW OF SYSTEMS:  CONSTITUTIONAL: No fever, fatigue or weakness.  EYES: No blurred or double vision.  EARS, NOSE, AND THROAT: No tinnitus or ear pain.  RESPIRATORY: No cough, shortness of breath, wheezing or hemoptysis.  CARDIOVASCULAR: No chest pain, orthopnea, edema.  GASTROINTESTINAL: Reporting lower abdominal cramps and min GI bleeding with each bowel movement No nausea, vomiting, GENITOURINARY: No dysuria, hematuria.  ENDOCRINE: No polyuria, nocturia,  HEMATOLOGY: No anemia, easy bruising or bleeding SKIN: No rash or lesion. MUSCULOSKELETAL: No joint pain or arthritis.   NEUROLOGIC: No tingling, numbness, weakness.  PSYCHIATRY: No anxiety or depression.   DRUG ALLERGIES:  No Known Allergies  VITALS:  Blood pressure (!) 142/69, pulse 88, temperature 98.3 F (36.8 C), temperature source Oral, resp. rate 18, height 5\' 8"  (1.727 m), weight 50.8 kg (112 lb 1.6 oz), SpO2 98 %.  PHYSICAL EXAMINATION:  GENERAL:  56 y.o.-year-old patient lying in the bed with no acute distress.  EYES: Pupils equal, round, reactive to light and accommodation. No scleral icterus. Extraocular muscles intact.  HEENT: Head atraumatic, normocephalic. Oropharynx and nasopharynx clear.  NECK:  Supple, no jugular venous distention. No thyroid enlargement, no tenderness.  LUNGS: Normal breath sounds bilaterally, no wheezing, rales,rhonchi or crepitation. No use of accessory muscles of respiration.  CARDIOVASCULAR: S1, S2 normal. No murmurs, rubs, or gallops.  ABDOMEN: Soft, lower abdomen is tender, no rebound tenderness  EXTREMITIES: No pedal edema, cyanosis, or clubbing.  NEUROLOGIC: Cranial nerves II through XII are intact. Muscle strength 5/5 in all extremities.  Sensation intact. Gait not checked.  PSYCHIATRIC: The patient is alert and oriented x 3.  SKIN: No obvious rash, lesion, or ulcer.    LABORATORY PANEL:   CBC  Recent Labs Lab 08/26/17 0240 08/26/17 0949  WBC 19.0*  --   HGB 10.6* 9.6*  HCT 32.6* 29.8*  PLT 366  --    ------------------------------------------------------------------------------------------------------------------  Chemistries   Recent Labs Lab 08/24/17 2003  08/26/17 0240  NA 132*  < > 132*  K 3.6  < > 3.7  CL 99*  < > 97*  CO2 27  < > 27  GLUCOSE 164*  < > 192*  BUN 11  < > 7  CREATININE 0.56*  < > 0.66  CALCIUM 8.7*  < > 8.1*  AST 21  --   --   ALT 19  --   --   ALKPHOS 74  --   --   BILITOT 0.5  --   --   < > = values in this interval not displayed. ------------------------------------------------------------------------------------------------------------------  Cardiac Enzymes No results for input(s): TROPONINI in the last 168 hours. ------------------------------------------------------------------------------------------------------------------  RADIOLOGY:  Ct Abdomen Pelvis W Contrast  Result Date: 08/24/2017 CLINICAL DATA:  Initial evaluation for acute left lower quadrant and periumbilical abdominal pain. EXAM: CT ABDOMEN AND PELVIS WITH CONTRAST TECHNIQUE: Multidetector CT imaging of the abdomen and pelvis was performed using the standard protocol following bolus administration of intravenous contrast. CONTRAST:  22mL ISOVUE-300 IOPAMIDOL (ISOVUE-300) INJECTION 61% COMPARISON:  Prior CT from 06/30/2017. FINDINGS: Lower chest: Minimal subsegmental atelectasis present within the lung bases. Visualized lungs otherwise clear. Hepatobiliary: Subcentimeter hypodensity within the subcapsular left hepatic lobe noted, too small the characterize. Liver otherwise unremarkable.  Gallbladder within normal limits. No biliary dilatation. Pancreas: Pancreas within normal limits. Spleen: Spleen within normal  limits. Adrenals/Urinary Tract: The adrenal glands are normal. Kidneys equal in size with symmetric enhancement. No nephrolithiasis, hydronephrosis, or focal enhancing renal mass. The no hydroureter. Partially distended bladder within normal limits. Stomach/Bowel: Stomach within normal limits. No evidence for small bowel obstruction. Diffuse wall thickening with mucosal enhancement about the colon, consistent with acute colitis. Changes most notable at the splenic flexure. Associated pneumatosis involving the ascending and transverse colon. No gas within the SMV or portal vein. No other acute inflammatory changes about the bowels. Vascular/Lymphatic: Mild aortic atherosclerosis. No aneurysm. Normal intravascular enhancement seen throughout the intra-abdominal aorta on. Aortic branch vessels appear patent proximally, with specific note made of the SMA and IMA. No adenopathy. Reproductive: Prostate normal. Other: Small volume free reactive fluid about the inflamed colon. No free intraperitoneal air. Musculoskeletal: Compression deformity involving the L4 vertebral body has progressed relative to prior CT from 06/30/2017, now measuring up to approximately 70% with 4 mm bony retropulsion. An acute component not excluded. No other acute osseous abnormality. No worrisome lytic or blastic osseous lesions. IMPRESSION: 1. Findings consistent with acute colitis, with greatest involvement at the descending and sigmoid colon. Associated pneumatosis within the ascending and transverse colon. Correlation with serum lactate recommended. No portal venous gas. 2. Further collapse of the previously seen comminuted L4 vertebral fracture, now measuring up to 70% with 4 mm bony retropulsion. A superimposed acute component is not excluded. 3. No other acute abnormality within the abdomen and pelvis. Electronically Signed   By: Jeannine Boga M.D.   On: 08/24/2017 23:24    EKG:   Orders placed or performed during the hospital  encounter of 06/28/17  . ED EKG  . ED EKG  . EKG 12-Lead  . EKG 12-Lead  . ED EKG  . ED EKG    ASSESSMENT AND PLAN:     #Acute Colitis -unclear etiology Clinically improving, closely monitor C. difficile was negative, GI panel was negative  IV antibiotics Zosyn is changed to Unasyn  CT scan showed some pneumatosis so surgery was consulted, recommending IV antibiotics and no surgical interventions at this time Continue nothing by mouth  #Recent history of listeria meningitis Patient is on IV ampicillin until this Thursday; changed antibiotics to IV Unasyn after discussing with infectious disease which would cover both listeria meningitis and acute colitis  #GI bleed - from acute colitis  Monitor hemoglobin and hematocrit closely and nothing by mouth  Hb 11.1-10.6-9.6., transfuse PRN GI is following  #Hyponatremia Hydrate with IV fluids and monitor renal function closely Monitor serial sodiums Patient is mentating fine  #  Type 1 diabetes ([place]) - sliding scale insulin with corresponding glucose checks    # Hypothyroidism - home dose thyroid replacement    All the records are reviewed and case discussed with Care Management/Social Workerr. Management plans discussed with the patient, family and they are in agreement.  CODE STATUS: fc   TOTAL TIME TAKING CARE OF THIS PATIENT: 36 minutes.   POSSIBLE D/C IN  2-3  DAYS, DEPENDING ON CLINICAL CONDITION.  Note: This dictation was prepared with Dragon dictation along with smaller phrase technology. Any transcriptional errors that result from this process are unintentional.   [name] M.D on 08/26/2017 at 1:04 PM  Between 7am to 6pm - Pager - [phone] After 6pm go to [email or web address] - password [email or web address] [facility]  [facility]  Office  [phone]  CC: Primary  care physician; Patient, No Pcp Per

## 2017-08-26 NOTE — Progress Notes (Signed)
[name], MD [facility]   [address], [place], [place] [address] Phone: [phone] Fax : [phone]   Subjective: The patient reports that his abdominal pain and rectal bleeding is slightly better but still significantly bothersome.  The patient has no new problems today.   Objective: Vital signs in last 24 hours: Vitals:   08/25/17 1644 08/25/17 1946 08/25/17 2337 08/26/17 0556  BP: (!) 144/66 (!) 143/69 (!) 141/51 (!) 142/69  Pulse: 82 96 73 88  Resp:  18 18 18   Temp: 98.7 F (37.1 C) 98.4 F (36.9 C) 98.9 F (37.2 C) 98.3 F (36.8 C)  TempSrc: Oral Oral Oral Oral  SpO2: 100% 99% 100% 98%  Weight:      Height:       Weight change:   Intake/Output Summary (Last 24 hours) at 08/26/17 1747 Last data filed at 08/25/17 1816  Gross per 24 hour  Intake           256.67 ml  Output                0 ml  Net           256.67 ml     Exam: Heart:: Regular rate and rhythm, S1S2 present or without murmur or extra heart sounds Lungs: normal and clear to auscultation and percussion Abdomen: Soft tender diffusely with positive rebound   Lab Results: @LABTEST2 @ Micro Results: Recent Results (from the past 240 hour(s))  C difficile quick scan w PCR reflex     Status: None   Collection Time: 08/24/17  8:26 PM  Result Value Ref Range Status   C Diff antigen NEGATIVE NEGATIVE Final   C Diff toxin NEGATIVE NEGATIVE Final   C Diff interpretation No C. difficile detected.  Final  Gastrointestinal Panel by PCR , Stool     Status: None   Collection Time: 08/24/17  8:35 PM  Result Value Ref Range Status   Campylobacter species NOT DETECTED NOT DETECTED Final   Plesimonas shigelloides NOT DETECTED NOT DETECTED Final   Salmonella species NOT DETECTED NOT DETECTED Final   Yersinia enterocolitica NOT DETECTED NOT DETECTED Final   Vibrio species NOT DETECTED NOT DETECTED Final   Vibrio cholerae NOT DETECTED NOT DETECTED Final   Enteroaggregative E coli (EAEC) NOT DETECTED  NOT DETECTED Final   Enteropathogenic E coli (EPEC) NOT DETECTED NOT DETECTED Final   Enterotoxigenic E coli (ETEC) NOT DETECTED NOT DETECTED Final   Shiga like toxin producing E coli (STEC) NOT DETECTED NOT DETECTED Final   Shigella/Enteroinvasive E coli (EIEC) NOT DETECTED NOT DETECTED Final   Cryptosporidium NOT DETECTED NOT DETECTED Final   Cyclospora cayetanensis NOT DETECTED NOT DETECTED Final   Entamoeba histolytica NOT DETECTED NOT DETECTED Final   Giardia lamblia NOT DETECTED NOT DETECTED Final   Adenovirus F40/41 NOT DETECTED NOT DETECTED Final   Astrovirus NOT DETECTED NOT DETECTED Final   Norovirus GI/GII NOT DETECTED NOT DETECTED Final   Rotavirus A NOT DETECTED NOT DETECTED Final   Sapovirus (I, II, IV, and V) NOT DETECTED NOT DETECTED Final   Studies/Results: Ct Abdomen Pelvis W Contrast  Result Date: 08/24/2017 CLINICAL DATA:  Initial evaluation for acute left lower quadrant and periumbilical abdominal pain. EXAM: CT ABDOMEN AND PELVIS WITH CONTRAST TECHNIQUE: Multidetector CT imaging of the abdomen and pelvis was performed using the standard protocol following bolus administration of intravenous contrast. CONTRAST:  60mL ISOVUE-300 IOPAMIDOL (ISOVUE-300) INJECTION 61% COMPARISON:  Prior CT from 06/30/2017. FINDINGS: Lower chest:  Minimal subsegmental atelectasis present within the lung bases. Visualized lungs otherwise clear. Hepatobiliary: Subcentimeter hypodensity within the subcapsular left hepatic lobe noted, too small the characterize. Liver otherwise unremarkable. Gallbladder within normal limits. No biliary dilatation. Pancreas: Pancreas within normal limits. Spleen: Spleen within normal limits. Adrenals/Urinary Tract: The adrenal glands are normal. Kidneys equal in size with symmetric enhancement. No nephrolithiasis, hydronephrosis, or focal enhancing renal mass. The no hydroureter. Partially distended bladder within normal limits. Stomach/Bowel: Stomach within normal  limits. No evidence for small bowel obstruction. Diffuse wall thickening with mucosal enhancement about the colon, consistent with acute colitis. Changes most notable at the splenic flexure. Associated pneumatosis involving the ascending and transverse colon. No gas within the SMV or portal vein. No other acute inflammatory changes about the bowels. Vascular/Lymphatic: Mild aortic atherosclerosis. No aneurysm. Normal intravascular enhancement seen throughout the intra-abdominal aorta on. Aortic branch vessels appear patent proximally, with specific note made of the SMA and IMA. No adenopathy. Reproductive: Prostate normal. Other: Small volume free reactive fluid about the inflamed colon. No free intraperitoneal air. Musculoskeletal: Compression deformity involving the L4 vertebral body has progressed relative to prior CT from 06/30/2017, now measuring up to approximately 70% with 4 mm bony retropulsion. An acute component not excluded. No other acute osseous abnormality. No worrisome lytic or blastic osseous lesions. IMPRESSION: 1. Findings consistent with acute colitis, with greatest involvement at the descending and sigmoid colon. Associated pneumatosis within the ascending and transverse colon. Correlation with serum lactate recommended. No portal venous gas. 2. Further collapse of the previously seen comminuted L4 vertebral fracture, now measuring up to 70% with 4 mm bony retropulsion. A superimposed acute component is not excluded. 3. No other acute abnormality within the abdomen and pelvis. Electronically Signed   By: Jeannine Boga M.D.   On: 08/24/2017 23:24   Medications: I have reviewed the patient's current medications. Scheduled Meds: . divalproex  500 mg Oral BID  . insulin aspart  0-9 Units Subcutaneous Q6H  . insulin detemir  15 Units Subcutaneous Daily  . levothyroxine  125 mcg Oral QAC breakfast  . pantoprazole  40 mg Oral Daily   Continuous Infusions: . ampicillin-sulbactam  (UNASYN) IV Stopped (08/26/17 1741)  . dextrose 5 % and 0.9% NaCl 100 mL/hr at 08/26/17 0156   PRN Meds:.acetaminophen **OR** acetaminophen, loperamide, menthol-cetylpyridinium, ondansetron **OR** ondansetron (ZOFRAN) IV, oxyCODONE, traMADol   Assessment: Principal Problem:   Colitis Active Problems:   Type 1 diabetes (H[facility])   Hypothyroidism   GI bleed   Hematochezia    Plan: This patient has continued abdominal pain with rectal bleeding and diarrhea.  The patient was found to have colitis.  I discussed the case with the surgeon and hospitalist and have reiterated that doing any procedures on this patient would be high risk for perforation and that continued supportive care with antibiotics and hydration would be preferable at this time.  If he does not improve in the next 1-2 days then a flexible sigmoidoscopy would be considered.   LOS: 1 day   [name] 08/26/2017, 5:47 PM

## 2017-08-26 NOTE — Progress Notes (Signed)
[facility] INFECTIOUS DISEASE PROGRESS NOTE Date of Admission:  08/24/2017     ID: LA DIBELLA is a 56 y.o. male with colitis, listeria meningitis Principal Problem:   Colitis Active Problems:   Type 1 diabetes (HCC)   Hypothyroidism   GI bleed   Hematochezia   Subjective: No fevers, wbc 19, hgb had dropped. Some pain persists, still with crampy mild abd pain and blood per rectum no stool outpt per pt   ROS  Eleven systems are reviewed and negative except per hpi  Medications:  Antibiotics Given (last 72 hours)    Date/Time Action Medication Dose Rate   08/24/17 2358 New Bag/Given   piperacillin-tazobactam (ZOSYN) IVPB 3.375 g 3.375 g 100 mL/hr   08/25/17 0304 New Bag/Given   metroNIDAZOLE (FLAGYL) IVPB 500 mg 500 mg 100 mL/hr   08/25/17 0407 New Bag/Given   ciprofloxacin (CIPRO) IVPB 400 mg 400 mg 200 mL/hr   08/25/17 0842 New Bag/Given   metroNIDAZOLE (FLAGYL) IVPB 500 mg 500 mg 100 mL/hr   08/25/17 1430 New Bag/Given   Ampicillin-Sulbactam (UNASYN) 3 g in sodium chloride 0.9 % 100 mL IVPB 3 g 200 mL/hr   08/25/17 2026 New Bag/Given   Ampicillin-Sulbactam (UNASYN) 3 g in sodium chloride 0.9 % 100 mL IVPB 3 g 200 mL/hr   08/26/17 0156 New Bag/Given   Ampicillin-Sulbactam (UNASYN) 3 g in sodium chloride 0.9 % 100 mL IVPB 3 g 200 mL/hr   08/26/17 0830 New Bag/Given   Ampicillin-Sulbactam (UNASYN) 3 g in sodium chloride 0.9 % 100 mL IVPB 3 g 200 mL/hr     . divalproex  500 mg Oral BID  . insulin aspart  0-9 Units Subcutaneous Q6H  . insulin detemir  15 Units Subcutaneous Daily  . levothyroxine  125 mcg Oral QAC breakfast  . pantoprazole  40 mg Oral Daily    Objective: Vital signs in last 24 hours: Temp:  [98.3 F (36.8 C)-98.9 F (37.2 C)] 98.3 F (36.8 C) (10/23 0556) Pulse Rate:  [73-96] 88 (10/23 0556) Resp:  [18] 18 (10/23 0556) BP: (141-144)/(51-69) 142/69 (10/23 0556) SpO2:  [98 %-100 %] 98 % (10/23 0556) Constitutional: He is oriented to person,  place, and time. Thin, chronically ill appearing HENT: anicteric  Mouth/Throat: Oropharynx is clear and dry . No oropharyngeal exudate.  Cardiovascular: Normal rate, regular rhythm and normal heart sounds.  Pulmonary/Chest: Effort normal and breath sounds normal. No respiratory distress. He has no wheezes.  Abdominal: Soft. Bowel sounds are normal. He exhibits no distension. Mild ttp midline lower abd  Lymphadenopathy:  He has no cervical adenopathy.  Neurological: He is alert and oriented to person, place, and time.  Skin: Skin is warm and dry. No rash noted. No erythema.  Psychiatric: He has a normal mood and affect. His behavior is normal.   Lab Results  Recent Labs  08/25/17 0332  08/26/17 0240 08/26/17 0949  WBC 18.3*  --  19.0*  --   HGB 11.7*  < > 10.6* 9.6*  HCT 36.2*  < > 32.6* 29.8*  NA 129*  --  132*  --   K 3.9  --  3.7  --   CL 97*  --  97*  --   CO2 26  --  27  --   BUN 10  --  7  --   CREATININE 0.62  --  0.66  --   < > = values in this interval not displayed.  Microbiology: Results for orders placed  or performed during the hospital encounter of 08/24/17  C difficile quick scan w PCR reflex     Status: None   Collection Time: 08/24/17  8:26 PM  Result Value Ref Range Status   C Diff antigen NEGATIVE NEGATIVE Final   C Diff toxin NEGATIVE NEGATIVE Final   C Diff interpretation No C. difficile detected.  Final  Gastrointestinal Panel by PCR , Stool     Status: None   Collection Time: 08/24/17  8:35 PM  Result Value Ref Range Status   Campylobacter species NOT DETECTED NOT DETECTED Final   Plesimonas shigelloides NOT DETECTED NOT DETECTED Final   Salmonella species NOT DETECTED NOT DETECTED Final   Yersinia enterocolitica NOT DETECTED NOT DETECTED Final   Vibrio species NOT DETECTED NOT DETECTED Final   Vibrio cholerae NOT DETECTED NOT DETECTED Final   Enteroaggregative E coli (EAEC) NOT DETECTED NOT DETECTED Final   Enteropathogenic E coli (EPEC) NOT  DETECTED NOT DETECTED Final   Enterotoxigenic E coli (ETEC) NOT DETECTED NOT DETECTED Final   Shiga like toxin producing E coli (STEC) NOT DETECTED NOT DETECTED Final   Shigella/Enteroinvasive E coli (EIEC) NOT DETECTED NOT DETECTED Final   Cryptosporidium NOT DETECTED NOT DETECTED Final   Cyclospora cayetanensis NOT DETECTED NOT DETECTED Final   Entamoeba histolytica NOT DETECTED NOT DETECTED Final   Giardia lamblia NOT DETECTED NOT DETECTED Final   Adenovirus F40/41 NOT DETECTED NOT DETECTED Final   Astrovirus NOT DETECTED NOT DETECTED Final   Norovirus GI/GII NOT DETECTED NOT DETECTED Final   Rotavirus A NOT DETECTED NOT DETECTED Final   Sapovirus (I, II, IV, and V) NOT DETECTED NOT DETECTED Final    Studies/Results: Ct Abdomen Pelvis W Contrast  Result Date: 08/24/2017 CLINICAL DATA:  Initial evaluation for acute left lower quadrant and periumbilical abdominal pain. EXAM: CT ABDOMEN AND PELVIS WITH CONTRAST TECHNIQUE: Multidetector CT imaging of the abdomen and pelvis was performed using the standard protocol following bolus administration of intravenous contrast. CONTRAST:  7mL ISOVUE-300 IOPAMIDOL (ISOVUE-300) INJECTION 61% COMPARISON:  Prior CT from 06/30/2017. FINDINGS: Lower chest: Minimal subsegmental atelectasis present within the lung bases. Visualized lungs otherwise clear. Hepatobiliary: Subcentimeter hypodensity within the subcapsular left hepatic lobe noted, too small the characterize. Liver otherwise unremarkable. Gallbladder within normal limits. No biliary dilatation. Pancreas: Pancreas within normal limits. Spleen: Spleen within normal limits. Adrenals/Urinary Tract: The adrenal glands are normal. Kidneys equal in size with symmetric enhancement. No nephrolithiasis, hydronephrosis, or focal enhancing renal mass. The no hydroureter. Partially distended bladder within normal limits. Stomach/Bowel: Stomach within normal limits. No evidence for small bowel obstruction. Diffuse  wall thickening with mucosal enhancement about the colon, consistent with acute colitis. Changes most notable at the splenic flexure. Associated pneumatosis involving the ascending and transverse colon. No gas within the SMV or portal vein. No other acute inflammatory changes about the bowels. Vascular/Lymphatic: Mild aortic atherosclerosis. No aneurysm. Normal intravascular enhancement seen throughout the intra-abdominal aorta on. Aortic branch vessels appear patent proximally, with specific note made of the SMA and IMA. No adenopathy. Reproductive: Prostate normal. Other: Small volume free reactive fluid about the inflamed colon. No free intraperitoneal air. Musculoskeletal: Compression deformity involving the L4 vertebral body has progressed relative to prior CT from 06/30/2017, now measuring up to approximately 70% with 4 mm bony retropulsion. An acute component not excluded. No other acute osseous abnormality. No worrisome lytic or blastic osseous lesions. IMPRESSION: 1. Findings consistent with acute colitis, with greatest involvement at the descending and sigmoid colon.  Associated pneumatosis within the ascending and transverse colon. Correlation with serum lactate recommended. No portal venous gas. 2. Further collapse of the previously seen comminuted L4 vertebral fracture, now measuring up to 70% with 4 mm bony retropulsion. A superimposed acute component is not excluded. 3. No other acute abnormality within the abdomen and pelvis. Electronically Signed   By: Jeannine Boga M.D.   On: 08/24/2017 23:24    Assessment/Plan: HESTON WIDENER is a 56 y.o. male with recent Listeria meningitis and bacteremia who was at [facility] on IV ampicillin (having finished gent), now admitted with bloody diarrhea and leukocytosis. CT shows colitis and pneumatosis coli. He has been seen by surgery and is not felt to need surgery.  Differential is broad but so far neg for infectious etiology with neg C diff and neg Stool  PCR.  Concern for ischemic colitis or other colonic cause.  Recommendations Continue unasyn - ampicillin is the drug of choice for Listeria and his stop date was 10/24 Monitor wbc and fever curve.  Consider repeating C diff test as pt reports that his stool was largely blood on admission so if becomes more diarrhea like may be worth retesting as C diff would be an obvious etiology in the pt on IV abx with colitis and Leukocytosis  Thank you very much for the consult. Will follow with you.  Leonel Ramsay   08/26/2017, 1:11 PM

## 2017-08-26 NOTE — Progress Notes (Signed)
Administered 47ml of D50, cbg 56, paged on call MD to alert. Waiting on response.

## 2017-08-26 NOTE — Progress Notes (Signed)
New orders from MD increase D5 ns to 195ml/hr

## 2017-08-27 DIAGNOSIS — K6389 Other specified diseases of intestine: Secondary | ICD-10-CM

## 2017-08-27 LAB — TYPE AND SCREEN
ABO/RH(D): O POS
Antibody Screen: NEGATIVE
UNIT DIVISION: 0
UNIT DIVISION: 0

## 2017-08-27 LAB — BPAM RBC
BLOOD PRODUCT EXPIRATION DATE: 201811142359
BLOOD PRODUCT EXPIRATION DATE: 201811142359
UNIT TYPE AND RH: 5100
Unit Type and Rh: 5100

## 2017-08-27 LAB — CBC WITH DIFFERENTIAL/PLATELET
BASOS ABS: 0.1 10*3/uL (ref 0–0.1)
BASOS PCT: 0 %
EOS ABS: 0 10*3/uL (ref 0–0.7)
Eosinophils Relative: 0 %
HEMATOCRIT: 28.8 % — AB (ref 40.0–52.0)
HEMOGLOBIN: 9.2 g/dL — AB (ref 13.0–18.0)
Lymphocytes Relative: 10 %
Lymphs Abs: 1.7 10*3/uL (ref 1.0–3.6)
MCH: 26.1 pg (ref 26.0–34.0)
MCHC: 31.8 g/dL — AB (ref 32.0–36.0)
MCV: 82.1 fL (ref 80.0–100.0)
MONOS PCT: 8 %
Monocytes Absolute: 1.5 10*3/uL — ABNORMAL HIGH (ref 0.2–1.0)
NEUTROS PCT: 82 %
Neutro Abs: 14.3 10*3/uL — ABNORMAL HIGH (ref 1.4–6.5)
Platelets: 350 10*3/uL (ref 150–440)
RBC: 3.51 MIL/uL — AB (ref 4.40–5.90)
RDW: 20.6 % — ABNORMAL HIGH (ref 11.5–14.5)
WBC: 17.5 10*3/uL — AB (ref 3.8–10.6)

## 2017-08-27 LAB — BASIC METABOLIC PANEL
Anion gap: 6 (ref 5–15)
BUN: <5 mg/dL — ABNORMAL LOW (ref 6–20)
CHLORIDE: 98 mmol/L — AB (ref 101–111)
CO2: 30 mmol/L (ref 22–32)
CREATININE: 0.56 mg/dL — AB (ref 0.61–1.24)
Calcium: 7.5 mg/dL — ABNORMAL LOW (ref 8.9–10.3)
GFR calc non Af Amer: >60 mL/min (ref >60)
Glucose, Bld: 152 mg/dL — ABNORMAL HIGH (ref 65–99)
Potassium: 3 mmol/L — ABNORMAL LOW (ref 3.5–5.1)
Sodium: 134 mmol/L — ABNORMAL LOW (ref 135–145)

## 2017-08-27 LAB — GLUCOSE, CAPILLARY
GLUCOSE-CAPILLARY: 151 mg/dL — AB (ref 65–99)
Glucose-Capillary: 165 mg/dL — ABNORMAL HIGH (ref 65–99)
Glucose-Capillary: 151 mg/dL — ABNORMAL HIGH (ref 65–99)
Glucose-Capillary: 221 mg/dL — ABNORMAL HIGH (ref 65–99)

## 2017-08-27 MED ORDER — INSULIN DETEMIR 100 UNIT/ML ~~LOC~~ SOLN
10.0000 [IU] | Freq: Every day | SUBCUTANEOUS | Status: DC
  Administered 2017-08-27 – 2017-08-29 (×3): 10 [IU] via SUBCUTANEOUS
  Filled 2017-08-27 (×4): qty 0.1

## 2017-08-27 MED ORDER — SODIUM CHLORIDE 0.9% FLUSH
10.0000 mL | INTRAVENOUS | Status: DC | PRN
Start: 2017-08-27 — End: 2017-08-29

## 2017-08-27 MED ORDER — POTASSIUM CHLORIDE 10 MEQ/100ML IV SOLN
10.0000 meq | INTRAVENOUS | Status: AC
  Administered 2017-08-27 (×3): 10 meq via INTRAVENOUS
  Filled 2017-08-27 (×3): qty 100

## 2017-08-27 MED ORDER — INSULIN ASPART 100 UNIT/ML ~~LOC~~ SOLN
0.0000 [IU] | Freq: Three times a day (TID) | SUBCUTANEOUS | Status: DC
  Administered 2017-08-27: 2 [IU] via SUBCUTANEOUS
  Administered 2017-08-27 – 2017-08-28 (×3): 3 [IU] via SUBCUTANEOUS
  Administered 2017-08-28 – 2017-08-29 (×2): 5 [IU] via SUBCUTANEOUS
  Administered 2017-08-29: 3 [IU] via SUBCUTANEOUS
  Filled 2017-08-27 (×7): qty 1

## 2017-08-27 NOTE — Progress Notes (Signed)
MEDICATION RELATED CONSULT NOTE - INITIAL   Pharmacy Consult for electrolyte monitoring and management  Indication: hypokalemia  No Known Allergies  Patient Measurements: Height: 5\' 8"  (172.7 cm) Weight: 112 lb 1.6 oz (50.8 kg) IBW/kg (Calculated) : 68.4  Vital Signs: Temp: 98 F (36.7 C) (10/24 0730) Temp Source: Oral (10/24 0730) BP: 104/45 (10/24 0730) Pulse Rate: 85 (10/24 0730) Intake/Output from previous day: No intake/output data recorded. Intake/Output from this shift: Total I/O In: 660 [P.O.:660] Out: -   Labs:  Recent Labs  08/24/17 2003 08/24/17 2035  08/25/17 0332  08/26/17 0240 08/26/17 0949 08/27/17 0430  WBC 17.4*  --   --  18.3*  --  19.0*  --  17.5*  HGB 11.6*  --   < > 11.7*  < > 10.6* 9.6* 9.2*  HCT 35.5*  --   < > 36.2*  < > 32.6* 29.8* 28.8*  PLT 398  --   --  401  --  366  --  350  APTT  --  30  --   --   --   --   --   --   CREATININE 0.56*  --   --  0.62  --  0.66  --  0.56*  ALBUMIN 3.1*  --   --   --   --   --   --   --   PROT 6.8  --   --   --   --   --   --   --   AST 21  --   --   --   --   --   --   --   ALT 19  --   --   --   --   --   --   --   ALKPHOS 74  --   --   --   --   --   --   --   BILITOT 0.5  --   --   --   --   --   --   --   < > = values in this interval not displayed. Estimated Creatinine Clearance: 74.1 mL/min (A) (by C-G formula based on SCr of 0.56 mg/dL (L)).   Microbiology: Recent Results (from the past 720 hour(s))  C difficile quick scan w PCR reflex     Status: None   Collection Time: 08/24/17  8:26 PM  Result Value Ref Range Status   C Diff antigen NEGATIVE NEGATIVE Final   C Diff toxin NEGATIVE NEGATIVE Final   C Diff interpretation No C. difficile detected.  Final  Gastrointestinal Panel by PCR , Stool     Status: None   Collection Time: 08/24/17  8:35 PM  Result Value Ref Range Status   Campylobacter species NOT DETECTED NOT DETECTED Final   Plesimonas shigelloides NOT DETECTED NOT DETECTED Final    Salmonella species NOT DETECTED NOT DETECTED Final   Yersinia enterocolitica NOT DETECTED NOT DETECTED Final   Vibrio species NOT DETECTED NOT DETECTED Final   Vibrio cholerae NOT DETECTED NOT DETECTED Final   Enteroaggregative E coli (EAEC) NOT DETECTED NOT DETECTED Final   Enteropathogenic E coli (EPEC) NOT DETECTED NOT DETECTED Final   Enterotoxigenic E coli (ETEC) NOT DETECTED NOT DETECTED Final   Shiga like toxin producing E coli (STEC) NOT DETECTED NOT DETECTED Final   Shigella/Enteroinvasive E coli (EIEC) NOT DETECTED NOT DETECTED Final   Cryptosporidium NOT DETECTED NOT DETECTED Final  Cyclospora cayetanensis NOT DETECTED NOT DETECTED Final   Entamoeba histolytica NOT DETECTED NOT DETECTED Final   Giardia lamblia NOT DETECTED NOT DETECTED Final   Adenovirus F40/41 NOT DETECTED NOT DETECTED Final   Astrovirus NOT DETECTED NOT DETECTED Final   Norovirus GI/GII NOT DETECTED NOT DETECTED Final   Rotavirus A NOT DETECTED NOT DETECTED Final   Sapovirus (I, II, IV, and V) NOT DETECTED NOT DETECTED Final    Medical History: Past Medical History:  Diagnosis Date  . Hypothyroidism   . Lumbar vertebral fracture (HCC)   . 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 Type 1 diabetes (HCC)     Medications:  Prescriptions Prior to Admission  Medication Sig Dispense Refill Last Dose  . ampicillin (OMNIPEN) 2 g injection Inject 2 g into the vein every 4 (four) hours.   unknown at unknown  . calcium carbonate (OSCAL) 1500 (600 Ca) MG TABS tablet Take 600 mg by mouth daily with breakfast.   unknown at unknown  . Cyanocobalamin 1000 MCG TBCR Take 1,000 mcg by mouth daily.   unknown at unknown  . divalproex (DEPAKOTE) 500 MG DR tablet Take 500 mg by mouth 2 (two) times daily.   unknown at unknown  . heparin flush 10 UNIT/ML SOLN injection Inject 50 Units into the vein 4 (four) times daily.   unknown at unknown  . insulin aspart (NOVOLOG FLEXPEN) 100 UNIT/ML FlexPen Inject 0-5 Units into the skin 3  (three) times daily with meals. If blood sugar is less than 60, call MD If blood sugar is 200-250, give 1 unit If blood sugar is 251-300, give 2 unit If blood sugar is 301-350, give 3 unit If blood sugar is 351-400, give 4 unit If blood sugar is 401-450, give 5 unit If blood sugar is greater than 450, call MD   unknown at unknown  . insulin detemir (LEVEMIR) 100 UNIT/ML injection Inject 0.08 mLs (8 Units total) into the skin 2 (two) times daily. (Patient taking differently: Inject 18-21 Units into the skin 2 (two) times daily. 21 units in the morning and 18 units at bedtime) 10 mL 11 unknown at unknown  . insulin lispro (HUMALOG) 100 UNIT/ML injection Inject 6 Units into the skin 3 (three) times daily before meals.   unknown at unknown  . levothyroxine (SYNTHROID, LEVOTHROID) 125 MCG tablet TAKE ONE TABLET BY MOUTH EVERY MORNING BEFORE BREAKFAST]  0 unknown at unknown  . Multiple Vitamin (MULTIVITAMIN WITH MINERALS) TABS tablet Take 1 tablet by mouth daily.   unknown at unknown  . oxyCODONE (OXY IR/ROXICODONE) 5 MG immediate release tablet Take 5 mg by mouth every 4 (four) hours as needed for severe pain.   prn at prn  . senna (SENOKOT) 8.6 MG TABS tablet Take 1 tablet by mouth daily.   unknown at unknown  . sodium chloride 0.9 % injection Inject 5 mLs into the vein as directed. Flush with 5 ml before and after antibiotic   unknown at unknown  . Vitamin D, Ergocalciferol, (DRISDOL) 50000 units CAPS capsule Take 50,000 Units by mouth every 7 (seven) days. Patient takes on Friday   unknown at unknown   Scheduled:  . divalproex  500 mg Oral BID  . insulin aspart  0-9 Units Subcutaneous Q6H  . insulin detemir  10 Units Subcutaneous Daily  . levothyroxine  125 mcg Oral QAC breakfast  . pantoprazole  40 mg Oral Daily   Infusions:  . ampicillin-sulbactam (UNASYN) IV 3 g (08/27/17 1454)  . dextrose  5 % and 0.9% NaCl 125 mL/hr at 08/27/17 1108  . potassium chloride     PRN: acetaminophen **OR**  acetaminophen, loperamide, menthol-cetylpyridinium, ondansetron **OR** ondansetron (ZOFRAN) IV, oxyCODONE, traMADol Anti-infectives    Start     Dose/Rate Route Frequency Ordered Stop   08/25/17 1430  Ampicillin-Sulbactam (UNASYN) 3 g in sodium chloride 0.9 % 100 mL IVPB     3 g 200 mL/hr over 30 Minutes Intravenous Every 6 hours 08/25/17 1428     08/25/17 1400  piperacillin-tazobactam (ZOSYN) IVPB 3.375 g  Status:  Discontinued     3.375 g 12.5 mL/hr over 240 Minutes Intravenous Every 8 hours 08/25/17 1213 08/25/17 1428   08/25/17 1215  piperacillin-tazobactam (ZOSYN) IVPB 3.375 g  Status:  Discontinued     3.375 g 100 mL/hr over 30 Minutes Intravenous Every 6 hours 08/25/17 1209 08/25/17 1213   08/25/17 0300  ciprofloxacin (CIPRO) IVPB 400 mg  Status:  Discontinued     400 mg 200 mL/hr over 60 Minutes Intravenous Every 12 hours 08/25/17 0208 08/25/17 1208   08/25/17 0230  metroNIDAZOLE (FLAGYL) IVPB 500 mg  Status:  Discontinued     500 mg 100 mL/hr over 60 Minutes Intravenous Every 8 hours 08/25/17 0128 08/25/17 1208   08/24/17 2345  piperacillin-tazobactam (ZOSYN) IVPB 3.375 g     3.375 g 100 mL/hr over 30 Minutes Intravenous  Once 08/24/17 2335 08/25/17 0055      Assessment: 56 year old male with K+ 3.0 requiring replacement   Goal of Therapy:  Electrolytes WNL (K 3.5 - 5.1)  Plan:  Will replace potassium with KCl IV 69meq q1h x 4 doses, recheck BMP with AM labs. Also check Mg+ with AM labs   Donna Christen Abimelec Grochowski 08/27/2017,3:14 PM

## 2017-08-27 NOTE — Progress Notes (Signed)
SURGICAL PROGRESS NOTE (cpt [phone])  Hospital Day(s): 2.   Post op day(s):  Marland Kitchen   Interval History: Patient seen and examined, no acute events or new complaints overnight. Patient reports his lower abdominal pain has improved since yesterday, since which he has also passed one formed BM and fewer/further apart episodes of blood per rectum. He otherwise denies any fever/chills, N/V, CP, or SOB and requests to be able to eat/drink something today.  Review of Systems:  Constitutional: denies fever, chills  HEENT: denies cough or congestion  Respiratory: denies any shortness of breath  Cardiovascular: denies chest pain or palpitations  Gastrointestinal: abdominal pain, N/V, and bowel function as per interval history Genitourinary: denies burning with urination or urinary frequency Musculoskeletal: denies pain, decreased motor or sensation Integumentary: denies any other rashes or skin discolorations Neurological: denies HA or vision/hearing changes   Vital signs in last 24 hours: [min-max] current  Temp:  [98.5 F (36.9 C)-99.1 F (37.3 C)] 98.5 F (36.9 C) (10/24 0400) Pulse Rate:  [88-106] 88 (10/24 0400) Resp:  [18] 18 (10/24 0400) BP: (110-134)/(56-57) 111/56 (10/24 0400) SpO2:  [97 %-100 %] 97 % (10/24 0400)     Height: 5\' 8"  (172.7 cm) Weight: 112 lb 1.6 oz (50.8 kg) BMI (Calculated): 17.05   Intake/Output this shift:  No intake/output data recorded.   Intake/Output last 2 shifts:  @IOLAST2SHIFTS @   Physical Exam:  Constitutional: alert, cooperative and no distress  HENT: normocephalic without obvious abnormality  Eyes: PERRL, EOM's grossly intact and symmetric  Neuro: CN II - XII grossly intact and symmetric without deficit  Respiratory: breathing non-labored at rest  Cardiovascular: regular rate and sinus rhythm  Gastrointestinal: soft and non-distended with mild suprapubic tenderness to palpation, LLQ now NT Musculoskeletal: UE and LE FROM, no edema or wounds, motor  and sensation grossly intact, NT   Labs:  CBC Latest Ref Rng & Units 08/27/2017 08/26/2017 08/26/2017  WBC 3.8 - 10.6 K/uL 17.5(H) - 19.0(H)  Hemoglobin 13.0 - 18.0 g/dL 9.2(L) 9.6(L) 10.6(L)  Hematocrit 40.0 - 52.0 % 28.8(L) 29.8(L) 32.6(L)  Platelets 150 - 440 K/uL 350 - 366   CMP Latest Ref Rng & Units 08/27/2017 08/26/2017 08/25/2017  Glucose 65 - 99 mg/dL 152(H) 192(H) 248(H)  BUN 6 - 20 mg/dL <5(L) 7 10  Creatinine 0.61 - 1.24 mg/dL 0.56(L) 0.66 0.62  Sodium 135 - 145 mmol/L 134(L) 132(L) 129(L)  Potassium 3.5 - 5.1 mmol/L 3.0(L) 3.7 3.9  Chloride 101 - 111 mmol/L 98(L) 97(L) 97(L)  CO2 22 - 32 mmol/L 30 27 26   Calcium 8.9 - 10.3 mg/dL 7.5(L) 8.1(L) 8.4(L)  Total Protein 6.5 - 8.1 g/dL - - -  Total Bilirubin 0.3 - 1.2 mg/dL - - -  Alkaline Phos 38 - 126 U/L - - -  AST 15 - 41 U/L - - -  ALT 17 - 63 U/L - - -   Imaging studies: No new pertinent imaging studies   Assessment/Plan: (ICD-10's: K92.2, K52.3) 56 y.o.malewith improving suprapubic >LLQ abdominal pain and acute lower GI bleed associated likely attributable to mucosal sloughing with pancolitis worst at hissplenic flexure andpneumatosis of the ascending and transverse colon -- concerning for ulcerative colitis (IBD) or infectious colitis vs less likely ischemic or diverticular or toxic/allergic exposure etiologies, none of which are fully consistent with or supported by workup thus far --complicated by slightly improved/decreased leukocytosis with normal lactic acid at admission andby comorbidities including DM type 1, antibiotics for recent listerial meningitis, hypothyroidism, and degenerative spine  disease.              - avoid hypotension - IV antibiotics as per primary medical team             - advanced to clear liquids diet, will monitor abdominal exam and bowel function - GI consultation appreciated re: pancolitis etiologies without mesentery arterial disease, thrombus, or  hypotension and lactate WNL - trend WBC and Hb, no indication for emergent surgical intervention at this time - medical management of comorbidities as per primary medical team - DVT prophylaxis, ambulation encouraged  All of the above findings and recommendations were discussed with the patient, his sister bedside, and his medical physician, and all of patient's and family's questions were answered to their expressed satisfaction.  Thank you for the opportunity to participate in this patient's care.   -- Marilynne Drivers Rosana Hoes, MD, [facility] - Partnering for exceptional care. Office: [phone]

## 2017-08-27 NOTE — Progress Notes (Signed)
[place] INFECTIOUS DISEASE PROGRESS NOTE Date of Admission:  08/24/2017     ID: Carlos Mueller is a 56 y.o. male with colitis, listeria meningitis Principal Problem:   Colitis Active Problems:   Type 1 diabetes (HCC)   Hypothyroidism   GI bleed   Hematochezia   Subjective: No fevers, wbc 17 , hgb had dropped to 9.2. Less blood per rectum and actually had a formed BM, tolerated clears this am. Less abd pin  ROS  Eleven systems are reviewed and negative except per hpi  Medications:  Antibiotics Given (last 72 hours)    Date/Time Action Medication Dose Rate   08/24/17 2358 New Bag/Given   piperacillin-tazobactam (ZOSYN) IVPB 3.375 g 3.375 g 100 mL/hr   08/25/17 0304 New Bag/Given   metroNIDAZOLE (FLAGYL) IVPB 500 mg 500 mg 100 mL/hr   08/25/17 0407 New Bag/Given   ciprofloxacin (CIPRO) IVPB 400 mg 400 mg 200 mL/hr   08/25/17 0842 New Bag/Given   metroNIDAZOLE (FLAGYL) IVPB 500 mg 500 mg 100 mL/hr   08/25/17 1430 New Bag/Given   Ampicillin-Sulbactam (UNASYN) 3 g in sodium chloride 0.9 % 100 mL IVPB 3 g 200 mL/hr   08/25/17 2026 New Bag/Given   Ampicillin-Sulbactam (UNASYN) 3 g in sodium chloride 0.9 % 100 mL IVPB 3 g 200 mL/hr   08/26/17 0156 New Bag/Given   Ampicillin-Sulbactam (UNASYN) 3 g in sodium chloride 0.9 % 100 mL IVPB 3 g 200 mL/hr   08/26/17 0830 New Bag/Given   Ampicillin-Sulbactam (UNASYN) 3 g in sodium chloride 0.9 % 100 mL IVPB 3 g 200 mL/hr   08/26/17 1711 New Bag/Given   Ampicillin-Sulbactam (UNASYN) 3 g in sodium chloride 0.9 % 100 mL IVPB 3 g 200 mL/hr   08/26/17 2117 New Bag/Given   Ampicillin-Sulbactam (UNASYN) 3 g in sodium chloride 0.9 % 100 mL IVPB 3 g 200 mL/hr   08/27/17 0140 New Bag/Given   Ampicillin-Sulbactam (UNASYN) 3 g in sodium chloride 0.9 % 100 mL IVPB 3 g 200 mL/hr     . divalproex  500 mg Oral BID  . insulin aspart  0-9 Units Subcutaneous Q6H  . insulin detemir  10 Units Subcutaneous Daily  . levothyroxine  125 mcg Oral QAC  breakfast  . pantoprazole  40 mg Oral Daily    Objective: Vital signs in last 24 hours: Temp:  [98 F (36.7 C)-99.1 F (37.3 C)] 98 F (36.7 C) (10/24 0730) Pulse Rate:  [85-106] 85 (10/24 0730) Resp:  [16-18] 16 (10/24 0730) BP: (104-134)/(45-57) 104/45 (10/24 0730) SpO2:  [96 %-100 %] 96 % (10/24 0730) Constitutional: He is oriented to person, place, and time. Thin, chronically ill appearing HENT: anicteric  Mouth/Throat: Oropharynx is clear and dry . No oropharyngeal exudate.  Cardiovascular: Normal rate, regular rhythm and normal heart sounds.  Pulmonary/Chest: Effort normal and breath sounds normal. No respiratory distress. He has no wheezes.  Abdominal: Soft. Bowel sounds are normal. He exhibits no distension. Mild ttp midline lower abd  Lymphadenopathy:  He has no cervical adenopathy.  Neurological: He is alert and oriented to person, place, and time.  Skin: Skin is warm and dry. No rash noted. No erythema.  Psychiatric: He has a normal mood and affect. His behavior is normal.   Lab Results  Recent Labs  08/26/17 0240 08/26/17 0949 08/27/17 0430  WBC 19.0*  --  17.5*  HGB 10.6* 9.6* 9.2*  HCT 32.6* 29.8* 28.8*  NA 132*  --  134*  K 3.7  --  3.0*  CL 97*  --  98*  CO2 27  --  30  BUN 7  --  <5*  CREATININE 0.66  --  0.56*    Microbiology: Results for orders placed or performed during the hospital encounter of 08/24/17  C difficile quick scan w PCR reflex     Status: None   Collection Time: 08/24/17  8:26 PM  Result Value Ref Range Status   C Diff antigen NEGATIVE NEGATIVE Final   C Diff toxin NEGATIVE NEGATIVE Final   C Diff interpretation No C. difficile detected.  Final  Gastrointestinal Panel by PCR , Stool     Status: None   Collection Time: 08/24/17  8:35 PM  Result Value Ref Range Status   Campylobacter species NOT DETECTED NOT DETECTED Final   Plesimonas shigelloides NOT DETECTED NOT DETECTED Final   Salmonella species NOT DETECTED NOT DETECTED  Final   Yersinia enterocolitica NOT DETECTED NOT DETECTED Final   Vibrio species NOT DETECTED NOT DETECTED Final   Vibrio cholerae NOT DETECTED NOT DETECTED Final   Enteroaggregative E coli (EAEC) NOT DETECTED NOT DETECTED Final   Enteropathogenic E coli (EPEC) NOT DETECTED NOT DETECTED Final   Enterotoxigenic E coli (ETEC) NOT DETECTED NOT DETECTED Final   Shiga like toxin producing E coli (STEC) NOT DETECTED NOT DETECTED Final   Shigella/Enteroinvasive E coli (EIEC) NOT DETECTED NOT DETECTED Final   Cryptosporidium NOT DETECTED NOT DETECTED Final   Cyclospora cayetanensis NOT DETECTED NOT DETECTED Final   Entamoeba histolytica NOT DETECTED NOT DETECTED Final   Giardia lamblia NOT DETECTED NOT DETECTED Final   Adenovirus F40/41 NOT DETECTED NOT DETECTED Final   Astrovirus NOT DETECTED NOT DETECTED Final   Norovirus GI/GII NOT DETECTED NOT DETECTED Final   Rotavirus A NOT DETECTED NOT DETECTED Final   Sapovirus (I, II, IV, and V) NOT DETECTED NOT DETECTED Final    Studies/Results: No results found.  Assessment/Plan: Carlos Mueller is a 56 y.o. male with recent Listeria meningitis and bacteremia who was at [facility] on IV ampicillin (having finished gent), now admitted with bloody diarrhea and leukocytosis. CT shows colitis and pneumatosis coli. He has been seen by surgery and is not felt to need surgery.  Differential is broad but so far neg for infectious etiology with neg C diff and neg Stool PCR.  Concern for ischemic colitis or other colonic cause. He is clinically improving with less bleeding and pain. WBC remains elevated  Recommendations Continue unasyn - ampicillin is the drug of choice for Listeria and his stop date was 10/24 but will extend for now until GI issues sorted.  Monitor wbc and fever curve.   Thank you very much for the consult. Will follow with you.  Leonel Ramsay   08/27/2017, 11:04 AM

## 2017-08-27 NOTE — Progress Notes (Signed)
Inpatient Diabetes Program Recommendations  AACE/ADA: New Consensus Statement on Inpatient Glycemic Control (2015)  Target Ranges:  Prepandial:   less than 140 mg/dL      [facility] postprandial:   less than 180 mg/dL (1-2 hours)      Critically ill patients:  140 - 180 mg/dL   Results for Carlos Mueller, Carlos Mueller (MRN [id number]) as of 08/27/2017 07:41  Ref. Range 08/26/2017 06:02 08/26/2017 11:56 08/26/2017 17:13 08/26/2017 20:27 08/26/2017 23:40 08/27/2017 06:02  Glucose-Capillary Latest Ref Range: 65 - 99 mg/dL 241 (H) 118 (H) 56 (L) 118 (H) 98 165 (H)    Review of Glycemic Control  Diabetes history: DM75 (dx at 56 years old; makes no insulin and will require basal, correction, and meal coverage insulin) Outpatient Diabetes medications: Levemir 21 units QAM, Levemir 18 units QPM, Novolog 0-5 units TID with meals Current orders for Inpatient glycemic control: Levemir 15 units daily, Novolog 0-9 units Q6H  Inpatient Diabetes Program Recommendations: Insulin - Basal: Please consider decreasing Levemir to 12 units daily.  Thanks, Barnie Alderman, RN, MSN, CDE Diabetes Coordinator Inpatient Diabetes Program [phone] (Team Pager from 8am to 5pm)

## 2017-08-27 NOTE — Progress Notes (Signed)
[facility] [facility] [employer] at [place] NAME: Carlos Mueller    MR#:  [id number]  DATE OF BIRTH:  [birth year]  SUBJECTIVE:  CHIEF COMPLAINT:  Patient is reporting lower abdominal pain is better, HUNGRY     REVIEW OF SYSTEMS:  CONSTITUTIONAL: No fever, fatigue or weakness.  EYES: No blurred or double vision.  EARS, NOSE, AND THROAT: No tinnitus or ear pain.  RESPIRATORY: No cough, shortness of breath, wheezing or hemoptysis.  CARDIOVASCULAR: No chest pain, orthopnea, edema.  GASTROINTESTINAL: Reporting lower abdominal cramps and min GI bleeding with each bowel movement No nausea, vomiting, GENITOURINARY: No dysuria, hematuria.  ENDOCRINE: No polyuria, nocturia,  HEMATOLOGY: No anemia, easy bruising or bleeding SKIN: No rash or lesion. MUSCULOSKELETAL: No joint pain or arthritis.   NEUROLOGIC: No tingling, numbness, weakness.  PSYCHIATRY: No anxiety or depression.   DRUG ALLERGIES:  No Known Allergies  VITALS:  Blood pressure (!) 104/45, pulse 85, temperature 98 F (36.7 C), temperature source Oral, resp. rate 16, height 5\' 8"  (1.727 m), weight 50.8 kg (112 lb 1.6 oz), SpO2 96 %.  PHYSICAL EXAMINATION:  GENERAL:  56 y.o.-year-old patient lying in the bed with no acute distress.  EYES: Pupils equal, round, reactive to light and accommodation. No scleral icterus. Extraocular muscles intact.  HEENT: Head atraumatic, normocephalic. Oropharynx and nasopharynx clear.  NECK:  Supple, no jugular venous distention. No thyroid enlargement, no tenderness.  LUNGS: Normal breath sounds bilaterally, no wheezing, rales,rhonchi or crepitation. No use of accessory muscles of respiration.  CARDIOVASCULAR: S1, S2 normal. No murmurs, rubs, or gallops.  ABDOMEN: Soft, lower abdomen is tender, no rebound tenderness  EXTREMITIES: No pedal edema, cyanosis, or clubbing.  NEUROLOGIC: Cranial nerves II through XII are intact. Muscle strength 5/5 in all extremities. Sensation  intact. Gait not checked.  PSYCHIATRIC: The patient is alert and oriented x 3.  SKIN: No obvious rash, lesion, or ulcer.    LABORATORY PANEL:   CBC  Recent Labs Lab 08/27/17 0430  WBC 17.5*  HGB 9.2*  HCT 28.8*  PLT 350   ------------------------------------------------------------------------------------------------------------------  Chemistries   Recent Labs Lab 08/24/17 2003  08/27/17 0430  NA 132*  < > 134*  K 3.6  < > 3.0*  CL 99*  < > 98*  CO2 27  < > 30  GLUCOSE 164*  < > 152*  BUN 11  < > <5*  CREATININE 0.56*  < > 0.56*  CALCIUM 8.7*  < > 7.5*  AST 21  --   --   ALT 19  --   --   ALKPHOS 74  --   --   BILITOT 0.5  --   --   < > = values in this interval not displayed. ------------------------------------------------------------------------------------------------------------------  Cardiac Enzymes No results for input(s): TROPONINI in the last 168 hours. ------------------------------------------------------------------------------------------------------------------  RADIOLOGY:  No results found.  EKG:   Orders placed or performed during the hospital encounter of 06/28/17  . ED EKG  . ED EKG  . EKG 12-Lead  . EKG 12-Lead  . ED EKG  . ED EKG    ASSESSMENT AND PLAN:     #Acute Colitis -unclear etiology Clinically improving, closely monitor C. difficile was negative, GI panel was negative  IV antibiotics Zosyn is changed to Unasyn  CT scan showed some pneumatosis so surgery was consulted, recommending IV antibiotics and no surgical interventions at this time Clear liquid diet   #Recent history of listeria meningitis Patient is on  IV ampicillin until this Thursday; changed antibiotics to IV Unasyn after discussing with infectious disease which would cover both listeria meningitis and acute colitis  #GI bleed - from acute colitis  Monitor hemoglobin and hematocrit closely and nothing by mouth  Hb 11.1-10.6-9.6.-9.2, transfuse PRN GI is  following, patient will be benefited with flexible sigmoidoscopy versus colonoscopy once clinically stable  #Hyponatremia Hydrate with IV fluids and monitor renal function closely Monitor serial sodiums Patient is mentating fine  #  Type 1 diabetes ([facility]) -with hypoglycemia sliding scale insulin with corresponding glucose checks Levemir dose decreased to 10 units    # Hypothyroidism - home dose thyroid replacement   Discussed with Dr. Rosana Hoes All the records are reviewed and case discussed with Care Management/Social Workerr. Management plans discussed with the patient, sister over phone and they are in agreement.  CODE STATUS: fc   TOTAL TIME TAKING CARE OF THIS PATIENT: 36 minutes.   POSSIBLE D/C IN  2-3  DAYS, DEPENDING ON CLINICAL CONDITION.  Note: This dictation was prepared with Dragon dictation along with smaller phrase technology. Any transcriptional errors that result from this process are unintentional.   [name] M.D on 08/27/2017 at 3:07 PM  Between 7am to 6pm - Pager - [phone] After 6pm go to [email or web address] - password [email or web address] [facility]  Office  [phone]  CC: Primary care physician; Patient, No Pcp Per

## 2017-08-28 DIAGNOSIS — K529 Noninfective gastroenteritis and colitis, unspecified: Secondary | ICD-10-CM

## 2017-08-28 LAB — BASIC METABOLIC PANEL
Anion gap: 7 (ref 5–15)
BUN: <5 mg/dL — ABNORMAL LOW (ref 6–20)
CALCIUM: 7.5 mg/dL — AB (ref 8.9–10.3)
CO2: 29 mmol/L (ref 22–32)
CREATININE: 0.54 mg/dL — AB (ref 0.61–1.24)
Chloride: 96 mmol/L — ABNORMAL LOW (ref 101–111)
Glucose, Bld: 89 mg/dL (ref 65–99)
Potassium: 3 mmol/L — ABNORMAL LOW (ref 3.5–5.1)
SODIUM: 132 mmol/L — AB (ref 135–145)

## 2017-08-28 LAB — GLUCOSE, CAPILLARY
GLUCOSE-CAPILLARY: 205 mg/dL — AB (ref 65–99)
GLUCOSE-CAPILLARY: 216 mg/dL — AB (ref 65–99)
GLUCOSE-CAPILLARY: 245 mg/dL — AB (ref 65–99)
Glucose-Capillary: 75 mg/dL (ref 65–99)
Glucose-Capillary: 125 mg/dL — ABNORMAL HIGH (ref 65–99)
Glucose-Capillary: 267 mg/dL — ABNORMAL HIGH (ref 65–99)

## 2017-08-28 LAB — CBC WITH DIFFERENTIAL/PLATELET
Basophils Absolute: 0 10*3/uL (ref 0–0.1)
Basophils Relative: 0 %
EOS ABS: 0.1 10*3/uL (ref 0–0.7)
EOS PCT: 0 %
HCT: 29.4 % — ABNORMAL LOW (ref 40.0–52.0)
Hemoglobin: 9.6 g/dL — ABNORMAL LOW (ref 13.0–18.0)
Lymphocytes Relative: 13 %
Lymphs Abs: 2.3 10*3/uL (ref 1.0–3.6)
MCH: 26.6 pg (ref 26.0–34.0)
MCHC: 32.5 g/dL (ref 32.0–36.0)
MCV: 81.8 fL (ref 80.0–100.0)
MONO ABS: 1.3 10*3/uL — AB (ref 0.2–1.0)
MONOS PCT: 7 %
Neutro Abs: 13.9 10*3/uL — ABNORMAL HIGH (ref 1.4–6.5)
Neutrophils Relative %: 80 %
PLATELETS: 371 10*3/uL (ref 150–440)
RBC: 3.6 MIL/uL — ABNORMAL LOW (ref 4.40–5.90)
RDW: 20 % — AB (ref 11.5–14.5)
WBC: 17.6 10*3/uL — ABNORMAL HIGH (ref 3.8–10.6)

## 2017-08-28 LAB — MAGNESIUM: MAGNESIUM: 1.4 mg/dL — AB (ref 1.7–2.4)

## 2017-08-28 MED ORDER — POTASSIUM CHLORIDE 10 MEQ/100ML IV SOLN
10.0000 meq | INTRAVENOUS | Status: AC
  Administered 2017-08-28 (×4): 10 meq via INTRAVENOUS
  Filled 2017-08-28 (×3): qty 100

## 2017-08-28 MED ORDER — MAGNESIUM SULFATE 4 GM/100ML IV SOLN
4.0000 g | Freq: Once | INTRAVENOUS | Status: AC
  Administered 2017-08-28: 4 g via INTRAVENOUS
  Filled 2017-08-28: qty 100

## 2017-08-28 MED ORDER — POTASSIUM CHLORIDE 10 MEQ/100ML IV SOLN
10.0000 meq | INTRAVENOUS | Status: DC
  Administered 2017-08-28: 10 meq via INTRAVENOUS
  Filled 2017-08-28 (×2): qty 100

## 2017-08-28 MED ORDER — IBUPROFEN 400 MG PO TABS: 400.0000 mg | ORAL_TABLET | Freq: Once | ORAL | Status: DC

## 2017-08-28 NOTE — Progress Notes (Signed)
Plan is for patient to D/C back to [facility] and continue short term rehab when medically stable. Per MD patient may be stable for D/C tomorrow. [facility] liaison is aware of above. Clinical Social Worker (CSW) will continue to follow and assist as needed.   [employer], LCSW [phone]

## 2017-08-28 NOTE — Progress Notes (Signed)
Inpatient Diabetes Program Recommendations  AACE/ADA: New Consensus Statement on Inpatient Glycemic Control (2015)  Target Ranges:  Prepandial:   less than 140 mg/dL      [facility] postprandial:   less than 180 mg/dL (1-2 hours)      Critically ill patients:  140 - 180 mg/dL   Lab Results  Component Value Date   GLUCAP 267 (H) 08/28/2017   HGBA1C 10.8 (H) 06/29/2017    Review of Glycemic Control  Results for ALGERNON, MUNDIE (MRN [id number]) as of 08/28/2017 14:19  Ref. Range 08/27/2017 16:21 08/27/2017 21:06 08/28/2017 02:13 08/28/2017 07:44 08/28/2017 11:50  Glucose-Capillary Latest Ref Range: 65 - 99 mg/dL 221 (H) 151 (H) 75 125 (H) 267 (H)    Diabetes history: DM9 (dx at 56 years old; makes no insulin and will require basal, correction, and meal coverage insulin)  Outpatient Diabetes medications: Levemir 21 units QAM, Levemir 18 units QPM, Novolog 0-5 units TID with meals  Current orders for Inpatient glycemic control: Levemir 10 units daily, Novolog 0-9 units Q6H  Inpatient Diabetes Program Recommendations:soft diet ordered- patient has Type 1 diabetes - please order mealtime insulin Novolog 2 units tid with meals (this will ensure he always gets insulin when he eats) and a   Custom Novolog correction scale 0-5 units       151-200  1 unit      201-250  2 units      251-300  3 units      301-350  4 units      351-400  5 units   Elevated CBG after lunch because he received no insulin at breakfast.   Gentry Fitz, RN, BA, MHA, CDE Diabetes Coordinator Inpatient Diabetes Program  [phone] (Team Pager) [phone] ([place]) 08/28/2017 2:22 PM

## 2017-08-28 NOTE — Progress Notes (Signed)
Motrin was discontinued by pharmacy because pt has GI bleeding

## 2017-08-28 NOTE — Progress Notes (Signed)
Pt medicated for headache with no relief during the night. Notified Dr Estanislado Pandy. Received order for motrin 400mg  po x 1 dose.

## 2017-08-28 NOTE — Progress Notes (Signed)
SURGICAL PROGRESS NOTE (cpt [id number])  Hospital Day(s): 3.   Post op day(s):  Marland Kitchen   Interval History: Patient seen and examined, no acute events or new complaints overnight. Patient reports no further blood per rectum and his pain has nearly resolved with tolerating soft diet and ambulation without difficulty, denies N/V, fever/chills, CP, or SOB.  Review of Systems:  Constitutional: denies fever, chills  HEENT: denies cough or congestion  Respiratory: denies any shortness of breath  Cardiovascular: denies chest pain or palpitations  Gastrointestinal: abdominal pain, N/V, and bowel function as per interval history Genitourinary: denies burning with urination or urinary frequency Musculoskeletal: denies pain, decreased motor or sensation Integumentary: denies any other rashes or skin discolorations Neurological: denies HA or vision/hearing changes   Vital signs in last 24 hours: [min-max] current  Temp:  [98 F (36.7 C)-99 F (37.2 C)] 99 F (37.2 C) (10/25 0421) Pulse Rate:  [85-92] 85 (10/25 0421) Resp:  [16-17] 16 (10/25 0421) BP: (104-123)/(45-56) 113/56 (10/25 0421) SpO2:  [96 %-99 %] 99 % (10/25 0421)     Height: 5\' 8"  (172.7 cm) Weight: 112 lb 1.6 oz (50.8 kg) BMI (Calculated): 17.05   Intake/Output this shift:  No intake/output data recorded.   Intake/Output last 2 shifts:  @IOLAST2SHIFTS @   Physical Exam:  Constitutional: alert, cooperative and no distress  HENT: normocephalic without obvious abnormality  Eyes: PERRL, EOM's grossly intact and symmetric  Neuro: CN II - XII grossly intact and symmetric without deficit  Respiratory: breathing non-labored at rest  Cardiovascular: regular rate and sinus rhythm  Gastrointestinal: soft and non-distended with only mild suprapubic abdominal tenderness to palpation Musculoskeletal: UE and LE FROM, no edema or wounds, motor and sensation grossly intact, NT   Labs:  CBC Latest Ref Rng & Units 08/28/2017 08/27/2017 08/26/2017   WBC 3.8 - 10.6 K/uL 17.6(H) 17.5(H) -  Hemoglobin 13.0 - 18.0 g/dL 9.6(L) 9.2(L) 9.6(L)  Hematocrit 40.0 - 52.0 % 29.4(L) 28.8(L) 29.8(L)  Platelets 150 - 440 K/uL 371 350 -   CMP Latest Ref Rng & Units 08/28/2017 08/27/2017 08/26/2017  Glucose 65 - 99 mg/dL 89 152(H) 192(H)  BUN 6 - 20 mg/dL <5(L) <5(L) 7  Creatinine 0.61 - 1.24 mg/dL 0.54(L) 0.56(L) 0.66  Sodium 135 - 145 mmol/L 132(L) 134(L) 132(L)  Potassium 3.5 - 5.1 mmol/L 3.0(L) 3.0(L) 3.7  Chloride 101 - 111 mmol/L 96(L) 98(L) 97(L)  CO2 22 - 32 mmol/L 29 30 27   Calcium 8.9 - 10.3 mg/dL 7.5(L) 7.5(L) 8.1(L)  Total Protein 6.5 - 8.1 g/dL - - -  Total Bilirubin 0.3 - 1.2 mg/dL - - -  Alkaline Phos 38 - 126 U/L - - -  AST 15 - 41 U/L - - -  ALT 17 - 63 U/L - - -   Imaging studies: No new pertinent imaging studies   Assessment/Plan: (ICD-10's: K92.2, K52.3) 56 y.o.malewith improving suprapubic >LLQ abdominal pain and acute lower GI bleed associated likely attributable to mucosal sloughingwith pancolitis worst at hissplenic flexure andpneumatosis of the ascending and transverse colon -- concerning for ulcerative colitis (IBD) orinfectious colitis vs less likely ischemic or diverticular or toxic/allergic exposure etiologies, none of which are fully consistent with or supported by workup thus far --complicated by slightly improved/decreasedleukocytosis withnormal lactic acid at admission andby comorbidities including DM type 1, antibiotics for recent listerial meningitis, hypothyroidism, and degenerative spine disease.  - avoid hypotension - IV --> PO antibiotics as per primary medical team - agree with soft diet, monitor abdominal exam  and bowel function - trend WBC and Hb, no indication for emergent surgical intervention at this time  - anticipate further outpatient GI workup including colonoscopy pending resolution of acute inflammation - medical management  and discharge planning as per primary medical team - DVT prophylaxis, ambulation encouraged  - will signoff, please call if questions  All of the above findings and recommendations were discussed with the patient, and all of patient's questions were answered to hisexpressed satisfaction.  Thank you for the opportunity to participate in this patient's care.   -- Marilynne Drivers Rosana Hoes, MD, [place]: [facility] - Partnering for exceptional care. Office: [phone]

## 2017-08-28 NOTE — Progress Notes (Signed)
[facility] at [place] NAME: Carlos Mueller    MR#:  [id number]  DATE OF BIRTH:  1961/05/01  SUBJECTIVE:  CHIEF COMPLAINT:  Patient is reporting lower abdominal pain is better, had a large bowel movement with no blood yesterday    REVIEW OF SYSTEMS:  CONSTITUTIONAL: No fever, fatigue or weakness.  EYES: No blurred or double vision.  EARS, NOSE, AND THROAT: No tinnitus or ear pain.  RESPIRATORY: No cough, shortness of breath, wheezing or hemoptysis.  CARDIOVASCULAR: No chest pain, orthopnea, edema.  GASTROINTESTINAL: Reporting improving lower abdominal cramps  GENITOURINARY: No dysuria, hematuria.  ENDOCRINE: No polyuria, nocturia,  HEMATOLOGY: No anemia, easy bruising or bleeding SKIN: No rash or lesion. MUSCULOSKELETAL: No joint pain or arthritis.   NEUROLOGIC: No tingling, numbness, weakness.  PSYCHIATRY: No anxiety or depression.   DRUG ALLERGIES:  No Known Allergies  VITALS:  Blood pressure (!) 117/49, pulse 85, temperature 98.6 F (37 C), temperature source Oral, resp. rate 16, height 5\' 8"  (1.727 m), weight 50.8 kg (112 lb 1.6 oz), SpO2 99 %.  PHYSICAL EXAMINATION:  GENERAL:  56 y.o.-year-old patient lying in the bed with no acute distress.  EYES: Pupils equal, round, reactive to light and accommodation. No scleral icterus. Extraocular muscles intact.  HEENT: Head atraumatic, normocephalic. Oropharynx and nasopharynx clear.  NECK:  Supple, no jugular venous distention. No thyroid enlargement, no tenderness.  LUNGS: Normal breath sounds bilaterally, no wheezing, rales,rhonchi or crepitation. No use of accessory muscles of respiration.  CARDIOVASCULAR: S1, S2 normal. No murmurs, rubs, or gallops.  ABDOMEN: Soft, lower abdomen is tender, no rebound tenderness  EXTREMITIES: No pedal edema, cyanosis, or clubbing.  NEUROLOGIC: Cranial nerves II through XII are intact. Muscle strength 5/5 in all extremities. Sensation intact. Gait  not checked.  PSYCHIATRIC: The patient is alert and oriented x 3.  SKIN: No obvious rash, lesion, or ulcer.    LABORATORY PANEL:   CBC  Recent Labs Lab 08/28/17 0408  WBC 17.6*  HGB 9.6*  HCT 29.4*  PLT 371   ------------------------------------------------------------------------------------------------------------------  Chemistries   Recent Labs Lab 08/24/17 2003  08/28/17 0408  NA 132*  < > 132*  K 3.6  < > 3.0*  CL 99*  < > 96*  CO2 27  < > 29  GLUCOSE 164*  < > 89  BUN 11  < > <5*  CREATININE 0.56*  < > 0.54*  CALCIUM 8.7*  < > 7.5*  MG  --   --  1.4*  AST 21  --   --   ALT 19  --   --   ALKPHOS 74  --   --   BILITOT 0.5  --   --   < > = values in this interval not displayed. ------------------------------------------------------------------------------------------------------------------  Cardiac Enzymes No results for input(s): TROPONINI in the last 168 hours. ------------------------------------------------------------------------------------------------------------------  RADIOLOGY:  No results found.  EKG:   Orders placed or performed during the hospital encounter of 06/28/17  . ED EKG  . ED EKG  . EKG 12-Lead  . EKG 12-Lead  . ED EKG  . ED EKG    ASSESSMENT AND PLAN:     #Acute Colitis -unclear etiology Clinically improving, closely monitor C. difficile was negative, GI panel was negative  IV antibiotics Zosyn is changed to Unasyn  CT scan showed some pneumatosis so surgery was consulted, recommending IV antibiotics and no surgical interventions at this time Clear liquid diet advanced to soft  diet  #Recent history of listeria meningitis Patient is on IV ampicillin until this Thursday; changed antibiotics to IV Unasyn after discussing with infectious disease which would cover both listeria meningitis and acute colitis  #GI bleed - from acute colitis  Monitor hemoglobin and hematocrit closely and nothing by mouth  Hb  11.1-10.6-9.6.-9.2, --9.6 transfuse PRN GI is following, patient will be benefited with flexible sigmoidoscopy versus colonoscopy once clinically stable  #Hypo kalemia Replete and recheck Hyponatremia improved sodium at 132   #  Type 1 diabetes ([facility]) -with hypoglycemia sliding scale insulin with corresponding glucose checks Levemir dose decreased to 10 units    # Hypothyroidism - home dose thyroid replacement   Discussed with Dr. Rosana Hoes All the records are reviewed and case discussed with Care Management/Social Workerr. Management plans discussed with the patient, sister over phone and they are in agreement.  CODE STATUS: fc   TOTAL TIME TAKING CARE OF THIS PATIENT: 36 minutes.   POSSIBLE D/C IN  2-3  DAYS, DEPENDING ON CLINICAL CONDITION.  Note: This dictation was prepared with Dragon dictation along with smaller phrase technology. Any transcriptional errors that result from this process are unintentional.   [name] M.D on 08/28/2017 at 4:43 PM  Between 7am to 6pm - Pager - [phone] After 6pm go to [email or web address] - password [email or web address] [facility]  Office  [phone]  CC: Primary care physician; Patient, No Pcp Per

## 2017-08-28 NOTE — Progress Notes (Signed)
[name], MD [facility]   [address], [place], [place] [address] Phone: [phone] Fax : [phone]   Subjective: The patient reports that his abdominal pain is much improved today although he continues to have some. The patient also reports that the blood has stopped and he is having soft nonbloody stools. He reports that he had a severe amount of abdominal cramps after being seen by a physician who palpated his abdomen yesterday.   Objective: Vital signs in last 24 hours: Vitals:   08/27/17 1620 08/27/17 1944 08/28/17 0421 08/28/17 0730  BP: (!) 111/50 (!) 123/56 (!) 113/56 (!) 108/48  Pulse: 92 87 85 78  Resp:  17 16 16   Temp: 98.8 F (37.1 C) 98.8 F (37.1 C) 99 F (37.2 C) 98.7 F (37.1 C)  TempSrc: Oral Oral Oral Oral  SpO2: 99% 99% 99% 98%  Weight:      Height:       Weight change:   Intake/Output Summary (Last 24 hours) at 08/28/17 1333 Last data filed at 08/28/17 1023  Gross per 24 hour  Intake          7328.75 ml  Output                0 ml  Net          7328.75 ml     Exam: Heart:: Regular rate and rhythm, S1S2 present or without murmur or extra heart sounds Lungs: normal and clear to auscultation and percussion Abdomen: soft, diffusely tender, normal bowel sounds   Lab Results: @LABTEST2 @ Micro Results: Recent Results (from the past 240 hour(s))  C difficile quick scan w PCR reflex     Status: None   Collection Time: 08/24/17  8:26 PM  Result Value Ref Range Status   C Diff antigen NEGATIVE NEGATIVE Final   C Diff toxin NEGATIVE NEGATIVE Final   C Diff interpretation No C. difficile detected.  Final  Gastrointestinal Panel by PCR , Stool     Status: None   Collection Time: 08/24/17  8:35 PM  Result Value Ref Range Status   Campylobacter species NOT DETECTED NOT DETECTED Final   Plesimonas shigelloides NOT DETECTED NOT DETECTED Final   Salmonella species NOT DETECTED NOT DETECTED Final   Yersinia enterocolitica NOT DETECTED NOT  DETECTED Final   Vibrio species NOT DETECTED NOT DETECTED Final   Vibrio cholerae NOT DETECTED NOT DETECTED Final   Enteroaggregative E coli (EAEC) NOT DETECTED NOT DETECTED Final   Enteropathogenic E coli (EPEC) NOT DETECTED NOT DETECTED Final   Enterotoxigenic E coli (ETEC) NOT DETECTED NOT DETECTED Final   Shiga like toxin producing E coli (STEC) NOT DETECTED NOT DETECTED Final   Shigella/Enteroinvasive E coli (EIEC) NOT DETECTED NOT DETECTED Final   Cryptosporidium NOT DETECTED NOT DETECTED Final   Cyclospora cayetanensis NOT DETECTED NOT DETECTED Final   Entamoeba histolytica NOT DETECTED NOT DETECTED Final   Giardia lamblia NOT DETECTED NOT DETECTED Final   Adenovirus F40/41 NOT DETECTED NOT DETECTED Final   Astrovirus NOT DETECTED NOT DETECTED Final   Norovirus GI/GII NOT DETECTED NOT DETECTED Final   Rotavirus A NOT DETECTED NOT DETECTED Final   Sapovirus (I, II, IV, and V) NOT DETECTED NOT DETECTED Final   Studies/Results: No results found. Medications: I have reviewed the patient's current medications. Scheduled Meds: . divalproex  500 mg Oral BID  . insulin aspart  0-9 Units Subcutaneous TID AC & HS  . insulin detemir  10 Units Subcutaneous  Daily  . levothyroxine  125 mcg Oral QAC breakfast  . pantoprazole  40 mg Oral Daily   Continuous Infusions: . ampicillin-sulbactam (UNASYN) IV Stopped (08/28/17 0839)  . dextrose 5 % and 0.9% NaCl 50 mL/hr at 08/27/17 2226  . potassium chloride 10 mEq (08/28/17 1244)   PRN Meds:.acetaminophen **OR** acetaminophen, loperamide, menthol-cetylpyridinium, ondansetron **OR** ondansetron (ZOFRAN) IV, oxyCODONE, sodium chloride flush, traMADol   Assessment: Principal Problem:   Indeterminate colitis Active Problems:   Type 1 diabetes (HCC)   Hypothyroidism   Acute gastrointestinal hemorrhage   Hematochezia   Pneumatosis intestinalis    Plan: This patient came in with bloody diarrhea and abdominal pain. The patient's abdominal  pain is improving and his rectal bleeding has resolved. The patient should follow up as an outpatient for further investigation of what his primary reason for him having these problems although there is no urgency to do any procedures that him at this time with pneumatosis seen on the CT scan. The patient has been explained this and given my card and will follow up as an outpatient.   LOS: 3 days   Carlos Mueller 08/28/2017, 1:33 PM

## 2017-08-28 NOTE — Progress Notes (Signed)
Discussed with patient about pain medicine and effects on heart rate. Patient verbalized how unhappy he is that he can't get all his home meds. On telemetry, HR in the 40's, dips to the 20's. Got patient home dosing of MS contin and holding PRN oxy to see effects on heart rate. Explained at length about this. Patient was not agreeable stating that "it wouldn't be your fault. I'm the one taking the pills." Again explained to patient that the doctor is aware of heart rate and that we would continue to monitor. Reviewed this with patient yesterday. Patient resting in room every time this RN walks by room, both today and yesterday. Wakes up to eat and call for pain meds and then falls back asleep. Room is dark and refuses to get out of bed or take neb treatments.

## 2017-08-28 NOTE — Progress Notes (Addendum)
MEDICATION RELATED CONSULT NOTE - INITIAL   Pharmacy Consult for electrolyte monitoring and management  Indication: hypokalemia  No Known Allergies  Patient Measurements: Height: 5\' 8"  (172.7 cm) Weight: 112 lb 1.6 oz (50.8 kg) IBW/kg (Calculated) : 68.4  Vital Signs: Temp: 99 F (37.2 C) (10/25 0421) Temp Source: Oral (10/25 0421) BP: 113/56 (10/25 0421) Pulse Rate: 85 (10/25 0421) Intake/Output from previous day: 10/24 0701 - 10/25 0700 In: 3664.4 [P.O.:1320; I.V.:5348.8] Out: -  Intake/Output from this shift: No intake/output data recorded.  Labs:  Recent Labs  08/26/17 0240 08/26/17 0949 08/27/17 0430 08/28/17 0408  WBC 19.0*  --  17.5* 17.6*  HGB 10.6* 9.6* 9.2* 9.6*  HCT 32.6* 29.8* 28.8* 29.4*  PLT 366  --  350 371  CREATININE 0.66  --  0.56* 0.54*  MG  --   --   --  1.4*   Estimated Creatinine Clearance: 74.1 mL/min (A) (by C-G formula based on SCr of 0.54 mg/dL (L)).   Microbiology: Recent Results (from the past 720 hour(s))  C difficile quick scan w PCR reflex     Status: None   Collection Time: 08/24/17  8:26 PM  Result Value Ref Range Status   C Diff antigen NEGATIVE NEGATIVE Final   C Diff toxin NEGATIVE NEGATIVE Final   C Diff interpretation No C. difficile detected.  Final  Gastrointestinal Panel by PCR , Stool     Status: None   Collection Time: 08/24/17  8:35 PM  Result Value Ref Range Status   Campylobacter species NOT DETECTED NOT DETECTED Final   Plesimonas shigelloides NOT DETECTED NOT DETECTED Final   Salmonella species NOT DETECTED NOT DETECTED Final   Yersinia enterocolitica NOT DETECTED NOT DETECTED Final   Vibrio species NOT DETECTED NOT DETECTED Final   Vibrio cholerae NOT DETECTED NOT DETECTED Final   Enteroaggregative E coli (EAEC) NOT DETECTED NOT DETECTED Final   Enteropathogenic E coli (EPEC) NOT DETECTED NOT DETECTED Final   Enterotoxigenic E coli (ETEC) NOT DETECTED NOT DETECTED Final   Shiga like toxin producing E coli  (STEC) NOT DETECTED NOT DETECTED Final   Shigella/Enteroinvasive E coli (EIEC) NOT DETECTED NOT DETECTED Final   Cryptosporidium NOT DETECTED NOT DETECTED Final   Cyclospora cayetanensis NOT DETECTED NOT DETECTED Final   Entamoeba histolytica NOT DETECTED NOT DETECTED Final   Giardia lamblia NOT DETECTED NOT DETECTED Final   Adenovirus F40/41 NOT DETECTED NOT DETECTED Final   Astrovirus NOT DETECTED NOT DETECTED Final   Norovirus GI/GII NOT DETECTED NOT DETECTED Final   Rotavirus A NOT DETECTED NOT DETECTED Final   Sapovirus (I, II, IV, and V) NOT DETECTED NOT DETECTED Final    Medical History: Past Medical History:  Diagnosis Date  . Hypothyroidism   . Lumbar vertebral fracture (HCC)   . 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 Type 1 diabetes (HCC)     Medications:  Prescriptions Prior to Admission  Medication Sig Dispense Refill Last Dose  . ampicillin (OMNIPEN) 2 g injection Inject 2 g into the vein every 4 (four) hours.   unknown at unknown  . calcium carbonate (OSCAL) 1500 (600 Ca) MG TABS tablet Take 600 mg by mouth daily with breakfast.   unknown at unknown  . Cyanocobalamin 1000 MCG TBCR Take 1,000 mcg by mouth daily.   unknown at unknown  . divalproex (DEPAKOTE) 500 MG DR tablet Take 500 mg by mouth 2 (two) times daily.   unknown at unknown  . heparin flush 10 UNIT/ML  SOLN injection Inject 50 Units into the vein 4 (four) times daily.   unknown at unknown  . insulin aspart (NOVOLOG FLEXPEN) 100 UNIT/ML FlexPen Inject 0-5 Units into the skin 3 (three) times daily with meals. If blood sugar is less than 60, call MD If blood sugar is 200-250, give 1 unit If blood sugar is 251-300, give 2 unit If blood sugar is 301-350, give 3 unit If blood sugar is 351-400, give 4 unit If blood sugar is 401-450, give 5 unit If blood sugar is greater than 450, call MD   unknown at unknown  . insulin detemir (LEVEMIR) 100 UNIT/ML injection Inject 0.08 mLs (8 Units total) into the skin 2 (two) times  daily. (Patient taking differently: Inject 18-21 Units into the skin 2 (two) times daily. 21 units in the morning and 18 units at bedtime) 10 mL 11 unknown at unknown  . insulin lispro (HUMALOG) 100 UNIT/ML injection Inject 6 Units into the skin 3 (three) times daily before meals.   unknown at unknown  . levothyroxine (SYNTHROID, LEVOTHROID) 125 MCG tablet TAKE ONE TABLET BY MOUTH EVERY MORNING BEFORE BREAKFAST]  0 unknown at unknown  . Multiple Vitamin (MULTIVITAMIN WITH MINERALS) TABS tablet Take 1 tablet by mouth daily.   unknown at unknown  . oxyCODONE (OXY IR/ROXICODONE) 5 MG immediate release tablet Take 5 mg by mouth every 4 (four) hours as needed for severe pain.   prn at prn  . senna (SENOKOT) 8.6 MG TABS tablet Take 1 tablet by mouth daily.   unknown at unknown  . sodium chloride 0.9 % injection Inject 5 mLs into the vein as directed. Flush with 5 ml before and after antibiotic   unknown at unknown  . Vitamin D, Ergocalciferol, (DRISDOL) 50000 units CAPS capsule Take 50,000 Units by mouth every 7 (seven) days. Patient takes on Friday   unknown at unknown   Scheduled:  . divalproex  500 mg Oral BID  . insulin aspart  0-9 Units Subcutaneous TID AC & HS  . insulin detemir  10 Units Subcutaneous Daily  . levothyroxine  125 mcg Oral QAC breakfast  . pantoprazole  40 mg Oral Daily   Infusions:  . ampicillin-sulbactam (UNASYN) IV Stopped (08/28/17 0333)  . dextrose 5 % and 0.9% NaCl 50 mL/hr at 08/27/17 2226   PRN: acetaminophen **OR** acetaminophen, loperamide, menthol-cetylpyridinium, ondansetron **OR** ondansetron (ZOFRAN) IV, oxyCODONE, sodium chloride flush, traMADol Anti-infectives    Start     Dose/Rate Route Frequency Ordered Stop   08/25/17 1430  Ampicillin-Sulbactam (UNASYN) 3 g in sodium chloride 0.9 % 100 mL IVPB     3 g 200 mL/hr over 30 Minutes Intravenous Every 6 hours 08/25/17 1428     08/25/17 1400  piperacillin-tazobactam (ZOSYN) IVPB 3.375 g  Status:  Discontinued      3.375 g 12.5 mL/hr over 240 Minutes Intravenous Every 8 hours 08/25/17 1213 08/25/17 1428   08/25/17 1215  piperacillin-tazobactam (ZOSYN) IVPB 3.375 g  Status:  Discontinued     3.375 g 100 mL/hr over 30 Minutes Intravenous Every 6 hours 08/25/17 1209 08/25/17 1213   08/25/17 0300  ciprofloxacin (CIPRO) IVPB 400 mg  Status:  Discontinued     400 mg 200 mL/hr over 60 Minutes Intravenous Every 12 hours 08/25/17 0208 08/25/17 1208   08/25/17 0230  metroNIDAZOLE (FLAGYL) IVPB 500 mg  Status:  Discontinued     500 mg 100 mL/hr over 60 Minutes Intravenous Every 8 hours 08/25/17 0128 08/25/17 1208   08/24/17  2345  piperacillin-tazobactam (ZOSYN) IVPB 3.375 g     3.375 g 100 mL/hr over 30 Minutes Intravenous  Once 08/24/17 2335 08/25/17 0055      Assessment: 56 year old male with K 3.0 and Mg 1.4 today requiring replacement. Received KCl IV 67mEq x 3 doses yesterday.   Goal of Therapy:  Electrolytes WNL (K 3.5 - 5.1)  Plan:  Will replace potassium with KCl IV 27meq q1h x 4 doses Will replace magnesium with Mg Sulfate IV 4g x once  Recheck BMP with AM labs. Also check Mg+ with AM labs   [place] Resident  08/28/2017,7:47 AM

## 2017-08-29 ENCOUNTER — Inpatient Hospital Stay: Payer: 59

## 2017-08-29 LAB — CBC WITH DIFFERENTIAL/PLATELET
BASOS PCT: 1 %
Basophils Absolute: 0.1 10*3/uL (ref 0–0.1)
EOS ABS: 0.1 10*3/uL (ref 0–0.7)
Eosinophils Relative: 1 %
HEMATOCRIT: 28.1 % — AB (ref 40.0–52.0)
HEMOGLOBIN: 9.2 g/dL — AB (ref 13.0–18.0)
Lymphocytes Relative: 11 %
Lymphs Abs: 1.6 10*3/uL (ref 1.0–3.6)
MCH: 26.6 pg (ref 26.0–34.0)
MCHC: 32.6 g/dL (ref 32.0–36.0)
MCV: 81.6 fL (ref 80.0–100.0)
MONOS PCT: 7 %
Monocytes Absolute: 1 10*3/uL (ref 0.2–1.0)
NEUTROS ABS: 11.7 10*3/uL — AB (ref 1.4–6.5)
NEUTROS PCT: 80 %
PLATELETS: 423 10*3/uL (ref 150–440)
RBC: 3.45 MIL/uL — AB (ref 4.40–5.90)
RDW: 19.9 % — ABNORMAL HIGH (ref 11.5–14.5)
WBC: 14.4 10*3/uL — AB (ref 3.8–10.6)

## 2017-08-29 LAB — BASIC METABOLIC PANEL
ANION GAP: 5 (ref 5–15)
BUN: <5 mg/dL — ABNORMAL LOW (ref 6–20)
CO2: 30 mmol/L (ref 22–32)
Calcium: 7.1 mg/dL — ABNORMAL LOW (ref 8.9–10.3)
Chloride: 95 mmol/L — ABNORMAL LOW (ref 101–111)
Creatinine, Ser: 0.69 mg/dL (ref 0.61–1.24)
GFR calc Af Amer: >60 mL/min (ref >60)
GLUCOSE: 253 mg/dL — AB (ref 65–99)
POTASSIUM: 3.4 mmol/L — AB (ref 3.5–5.1)
SODIUM: 130 mmol/L — AB (ref 135–145)

## 2017-08-29 LAB — GLUCOSE, CAPILLARY
Glucose-Capillary: 240 mg/dL — ABNORMAL HIGH (ref 65–99)
Glucose-Capillary: 275 mg/dL — ABNORMAL HIGH (ref 65–99)

## 2017-08-29 LAB — MAGNESIUM: MAGNESIUM: 1.8 mg/dL (ref 1.7–2.4)

## 2017-08-29 MED ORDER — INSULIN LISPRO 100 UNIT/ML ~~LOC~~ SOLN: 2.0000 [IU] | Freq: Three times a day (TID) | SUBCUTANEOUS | 0 refills | Status: DC

## 2017-08-29 MED ORDER — OXYCODONE HCL 5 MG PO TABS: 5.0000 mg | ORAL_TABLET | Freq: Four times a day (QID) | ORAL | 0 refills | Status: DC | PRN

## 2017-08-29 MED ORDER — POTASSIUM CHLORIDE CRYS ER 20 MEQ PO TBCR
20.0000 meq | EXTENDED_RELEASE_TABLET | Freq: Once | ORAL | Status: AC
  Administered 2017-08-29: 20 meq via ORAL
  Filled 2017-08-29: qty 1

## 2017-08-29 MED ORDER — ACETAMINOPHEN 325 MG PO TABS: 650.0000 mg | ORAL_TABLET | Freq: Four times a day (QID) | ORAL | Status: DC | PRN

## 2017-08-29 MED ORDER — INSULIN DETEMIR 100 UNIT/ML ~~LOC~~ SOLN: 10.0000 [IU] | Freq: Two times a day (BID) | SUBCUTANEOUS | 1 refills | Status: DC

## 2017-08-29 MED ORDER — DIVALPROEX SODIUM 500 MG PO DR TAB: 500.0000 mg | DELAYED_RELEASE_TABLET | Freq: Two times a day (BID) | ORAL | 0 refills | Status: DC

## 2017-08-29 MED ORDER — PANTOPRAZOLE SODIUM 40 MG PO TBEC: 40.0000 mg | DELAYED_RELEASE_TABLET | Freq: Every day | ORAL | 0 refills | Status: AC

## 2017-08-29 NOTE — Progress Notes (Signed)
[facility] INFECTIOUS DISEASE PROGRESS NOTE Date of Admission:  08/24/2017     ID: Carlos Mueller is a 56 y.o. male with colitis, listeria meningitis Principal Problem:   Indeterminate colitis Active Problems:   Type 1 diabetes ([place])   Hypothyroidism   Acute gastrointestinal hemorrhage   Hematochezia   Pneumatosis intestinalis   Colitis   Subjective: No fevers, tolerating liquid diet  ROS  Eleven systems are reviewed and negative except per hpi  Medications:  Antibiotics Given (last 72 hours)    Date/Time Action Medication Dose Rate   08/26/17 1711 New Bag/Given   Ampicillin-Sulbactam (UNASYN) 3 g in sodium chloride 0.9 % 100 mL IVPB 3 g 200 mL/hr   08/26/17 2117 New Bag/Given   Ampicillin-Sulbactam (UNASYN) 3 g in sodium chloride 0.9 % 100 mL IVPB 3 g 200 mL/hr   08/27/17 0140 New Bag/Given   Ampicillin-Sulbactam (UNASYN) 3 g in sodium chloride 0.9 % 100 mL IVPB 3 g 200 mL/hr   08/27/17 0900 New Bag/Given   Ampicillin-Sulbactam (UNASYN) 3 g in sodium chloride 0.9 % 100 mL IVPB 3 g 200 mL/hr   08/27/17 1454 New Bag/Given   Ampicillin-Sulbactam (UNASYN) 3 g in sodium chloride 0.9 % 100 mL IVPB 3 g 200 mL/hr   08/27/17 2047 New Bag/Given   Ampicillin-Sulbactam (UNASYN) 3 g in sodium chloride 0.9 % 100 mL IVPB  200 mL/hr   08/28/17 0303 New Bag/Given   Ampicillin-Sulbactam (UNASYN) 3 g in sodium chloride 0.9 % 100 mL IVPB 3 g 200 mL/hr   08/28/17 0753 New Bag/Given   Ampicillin-Sulbactam (UNASYN) 3 g in sodium chloride 0.9 % 100 mL IVPB 3 g 200 mL/hr   08/28/17 1554 New Bag/Given   Ampicillin-Sulbactam (UNASYN) 3 g in sodium chloride 0.9 % 100 mL IVPB 3 g 200 mL/hr   08/28/17 2127 New Bag/Given   Ampicillin-Sulbactam (UNASYN) 3 g in sodium chloride 0.9 % 100 mL IVPB 3 g 200 mL/hr   08/29/17 0321 New Bag/Given   Ampicillin-Sulbactam (UNASYN) 3 g in sodium chloride 0.9 % 100 mL IVPB 3 g 200 mL/hr   08/29/17 0856 New Bag/Given   Ampicillin-Sulbactam (UNASYN) 3 g in  sodium chloride 0.9 % 100 mL IVPB 3 g 200 mL/hr     . divalproex  500 mg Oral BID  . insulin aspart  0-9 Units Subcutaneous TID AC & HS  . insulin detemir  10 Units Subcutaneous Daily  . levothyroxine  125 mcg Oral QAC breakfast  . pantoprazole  40 mg Oral Daily    Objective: Vital signs in last 24 hours: Temp:  [98.4 F (36.9 C)-99.8 F (37.7 C)] 98.4 F (36.9 C) (10/26 0716) Pulse Rate:  [74-91] 74 (10/26 0716) Resp:  [18-20] 19 (10/26 0716) BP: (115-130)/(49-67) 115/67 (10/26 0716) SpO2:  [98 %-99 %] 98 % (10/26 0716) Constitutional: He is oriented to person, place, and time. Thin, chronically ill appearing HENT: anicteric  Mouth/Throat: Oropharynx is clear and dry . No oropharyngeal exudate.  Cardiovascular: Normal rate, regular rhythm and normal heart sounds.  Pulmonary/Chest: Effort normal and breath sounds normal. No respiratory distress. He has no wheezes.  Abdominal: Soft. Bowel sounds are normal. He exhibits no distension. No tenderness Lymphadenopathy:  He has no cervical adenopathy.  Neurological: He is alert and oriented to person, place, and time.  Skin: Skin is warm and dry. No rash noted. No erythema.  Psychiatric: He has a normal mood and affect. His behavior is normal.   Lab Results  Recent Labs  08/28/17 0408 08/29/17 0413  WBC 17.6* 14.4*  HGB 9.6* 9.2*  HCT 29.4* 28.1*  NA 132* 130*  K 3.0* 3.4*  CL 96* 95*  CO2 29 30  BUN <5* <5*  CREATININE 0.54* 0.69    Microbiology: Results for orders placed or performed during the hospital encounter of 08/24/17  C difficile quick scan w PCR reflex     Status: None   Collection Time: 08/24/17  8:26 PM  Result Value Ref Range Status   C Diff antigen NEGATIVE NEGATIVE Final   C Diff toxin NEGATIVE NEGATIVE Final   C Diff interpretation No C. difficile detected.  Final  Gastrointestinal Panel by PCR , Stool     Status: None   Collection Time: 08/24/17  8:35 PM  Result Value Ref Range Status    Campylobacter species NOT DETECTED NOT DETECTED Final   Plesimonas shigelloides NOT DETECTED NOT DETECTED Final   Salmonella species NOT DETECTED NOT DETECTED Final   Yersinia enterocolitica NOT DETECTED NOT DETECTED Final   Vibrio species NOT DETECTED NOT DETECTED Final   Vibrio cholerae NOT DETECTED NOT DETECTED Final   Enteroaggregative E coli (EAEC) NOT DETECTED NOT DETECTED Final   Enteropathogenic E coli (EPEC) NOT DETECTED NOT DETECTED Final   Enterotoxigenic E coli (ETEC) NOT DETECTED NOT DETECTED Final   Shiga like toxin producing E coli (STEC) NOT DETECTED NOT DETECTED Final   Shigella/Enteroinvasive E coli (EIEC) NOT DETECTED NOT DETECTED Final   Cryptosporidium NOT DETECTED NOT DETECTED Final   Cyclospora cayetanensis NOT DETECTED NOT DETECTED Final   Entamoeba histolytica NOT DETECTED NOT DETECTED Final   Giardia lamblia NOT DETECTED NOT DETECTED Final   Adenovirus F40/41 NOT DETECTED NOT DETECTED Final   Astrovirus NOT DETECTED NOT DETECTED Final   Norovirus GI/GII NOT DETECTED NOT DETECTED Final   Rotavirus A NOT DETECTED NOT DETECTED Final   Sapovirus (I, II, IV, and V) NOT DETECTED NOT DETECTED Final    Studies/Results: Dg Pelvis 1-2 Views  Result Date: 08/29/2017 CLINICAL DATA:  Fall. EXAM: PELVIS - 1-2 VIEW COMPARISON:  None. FINDINGS: There is no evidence of pelvic fracture or diastasis. No pelvic bone lesions are seen. L4 fracture incompletely evaluated. IMPRESSION: Negative pelvis. Electronically Signed   By: Staci Righter M.D.   On: 08/29/2017 09:13    Assessment/Plan: [name] is a 56 y.o. male with recent Listeria meningitis and bacteremia who was at [facility] on IV ampicillin (having finished gent), now admitted with bloody diarrhea and leukocytosis. CT shows colitis and pneumatosis coli. He has been seen by surgery and is not felt to need surgery.  Differential is broad but so far neg for infectious etiology with neg C diff and neg Stool PCR.  Concern for  ischemic colitis or other colonic cause. He is clinically improving with no further bleeding and pain. WBC remains elevated but is decreasing  Recommendations He has completed his course of abx for the listeria.  Clinically improved so can dc off of abx I have given him and his sister my clinic info in case he worsens in next few weeks Otherwise he can fu with [facility] and with GI Thank you very much for the consult. Will follow with you.  Leonel Ramsay   08/29/2017, 2:52 PM

## 2017-08-29 NOTE — Progress Notes (Signed)
MEDICATION RELATED CONSULT NOTE - INITIAL   Pharmacy Consult for electrolyte monitoring and management  Indication: hypokalemia  No Known Allergies  Patient Measurements: Height: 5\' 8"  (172.7 cm) Weight: 112 lb 1.6 oz (50.8 kg) IBW/kg (Calculated) : 68.4  Vital Signs: Temp: 98.4 F (36.9 C) (10/26 0716) Temp Source: Oral (10/26 0716) BP: 115/67 (10/26 0716) Pulse Rate: 74 (10/26 0716) Intake/Output from previous day: 10/25 0701 - 10/26 0700 In: 1999 [P.O.:1140; I.V.:359; IV Piggyback:500] Out: -  Intake/Output from this shift: Total I/O In: 620 [P.O.:620] Out: -   Labs:  Recent Labs  08/27/17 0430 08/28/17 0408 08/29/17 0413  WBC 17.5* 17.6* 14.4*  HGB 9.2* 9.6* 9.2*  HCT 28.8* 29.4* 28.1*  PLT 350 371 423  CREATININE 0.56* 0.54* 0.69  MG  --  1.4* 1.8   Estimated Creatinine Clearance: 74.1 mL/min (by C-G formula based on SCr of 0.69 mg/dL).   Microbiology: Recent Results (from the past 720 hour(s))  C difficile quick scan w PCR reflex     Status: None   Collection Time: 08/24/17  8:26 PM  Result Value Ref Range Status   C Diff antigen NEGATIVE NEGATIVE Final   C Diff toxin NEGATIVE NEGATIVE Final   C Diff interpretation No C. difficile detected.  Final  Gastrointestinal Panel by PCR , Stool     Status: None   Collection Time: 08/24/17  8:35 PM  Result Value Ref Range Status   Campylobacter species NOT DETECTED NOT DETECTED Final   Plesimonas shigelloides NOT DETECTED NOT DETECTED Final   Salmonella species NOT DETECTED NOT DETECTED Final   Yersinia enterocolitica NOT DETECTED NOT DETECTED Final   Vibrio species NOT DETECTED NOT DETECTED Final   Vibrio cholerae NOT DETECTED NOT DETECTED Final   Enteroaggregative E coli (EAEC) NOT DETECTED NOT DETECTED Final   Enteropathogenic E coli (EPEC) NOT DETECTED NOT DETECTED Final   Enterotoxigenic E coli (ETEC) NOT DETECTED NOT DETECTED Final   Shiga like toxin producing E coli (STEC) NOT DETECTED NOT DETECTED  Final   Shigella/Enteroinvasive E coli (EIEC) NOT DETECTED NOT DETECTED Final   Cryptosporidium NOT DETECTED NOT DETECTED Final   Cyclospora cayetanensis NOT DETECTED NOT DETECTED Final   Entamoeba histolytica NOT DETECTED NOT DETECTED Final   Giardia lamblia NOT DETECTED NOT DETECTED Final   Adenovirus F40/41 NOT DETECTED NOT DETECTED Final   Astrovirus NOT DETECTED NOT DETECTED Final   Norovirus GI/GII NOT DETECTED NOT DETECTED Final   Rotavirus A NOT DETECTED NOT DETECTED Final   Sapovirus (I, II, IV, and V) NOT DETECTED NOT DETECTED Final    Medical History: Past Medical History:  Diagnosis Date  . Hypothyroidism   . Lumbar vertebral fracture (HCC)   . 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 Type 1 diabetes (HCC)     Medications:  Prescriptions Prior to Admission  Medication Sig Dispense Refill Last Dose  . ampicillin (OMNIPEN) 2 g injection Inject 2 g into the vein every 4 (four) hours.   unknown at unknown  . calcium carbonate (OSCAL) 1500 (600 Ca) MG TABS tablet Take 600 mg by mouth daily with breakfast.   unknown at unknown  . Cyanocobalamin 1000 MCG TBCR Take 1,000 mcg by mouth daily.   unknown at unknown  . divalproex (DEPAKOTE) 500 MG DR tablet Take 500 mg by mouth 2 (two) times daily.   unknown at unknown  . heparin flush 10 UNIT/ML SOLN injection Inject 50 Units into the vein 4 (four) times daily.  unknown at unknown  . insulin aspart (NOVOLOG FLEXPEN) 100 UNIT/ML FlexPen Inject 0-5 Units into the skin 3 (three) times daily with meals. If blood sugar is less than 60, call MD If blood sugar is 200-250, give 1 unit If blood sugar is 251-300, give 2 unit If blood sugar is 301-350, give 3 unit If blood sugar is 351-400, give 4 unit If blood sugar is 401-450, give 5 unit If blood sugar is greater than 450, call MD   unknown at unknown  . insulin detemir (LEVEMIR) 100 UNIT/ML injection Inject 0.08 mLs (8 Units total) into the skin 2 (two) times daily. (Patient taking  differently: Inject 18-21 Units into the skin 2 (two) times daily. 21 units in the morning and 18 units at bedtime) 10 mL 11 unknown at unknown  . insulin lispro (HUMALOG) 100 UNIT/ML injection Inject 6 Units into the skin 3 (three) times daily before meals.   unknown at unknown  . levothyroxine (SYNTHROID, LEVOTHROID) 125 MCG tablet TAKE ONE TABLET BY MOUTH EVERY MORNING BEFORE BREAKFAST]  0 unknown at unknown  . Multiple Vitamin (MULTIVITAMIN WITH MINERALS) TABS tablet Take 1 tablet by mouth daily.   unknown at unknown  . oxyCODONE (OXY IR/ROXICODONE) 5 MG immediate release tablet Take 5 mg by mouth every 4 (four) hours as needed for severe pain.   prn at prn  . senna (SENOKOT) 8.6 MG TABS tablet Take 1 tablet by mouth daily.   unknown at unknown  . sodium chloride 0.9 % injection Inject 5 mLs into the vein as directed. Flush with 5 ml before and after antibiotic   unknown at unknown  . Vitamin D, Ergocalciferol, (DRISDOL) 50000 units CAPS capsule Take 50,000 Units by mouth every 7 (seven) days. Patient takes on Friday   unknown at unknown   Scheduled:  . divalproex  500 mg Oral BID  . insulin aspart  0-9 Units Subcutaneous TID AC & HS  . insulin detemir  10 Units Subcutaneous Daily  . levothyroxine  125 mcg Oral QAC breakfast  . pantoprazole  40 mg Oral Daily  . potassium chloride  20 mEq Oral Once   Infusions:  . ampicillin-sulbactam (UNASYN) IV Stopped (08/29/17 0926)   PRN: acetaminophen **OR** acetaminophen, loperamide, menthol-cetylpyridinium, ondansetron **OR** ondansetron (ZOFRAN) IV, oxyCODONE, sodium chloride flush, traMADol Anti-infectives    Start     Dose/Rate Route Frequency Ordered Stop   08/25/17 1430  Ampicillin-Sulbactam (UNASYN) 3 g in sodium chloride 0.9 % 100 mL IVPB     3 g 200 mL/hr over 30 Minutes Intravenous Every 6 hours 08/25/17 1428     08/25/17 1400  piperacillin-tazobactam (ZOSYN) IVPB 3.375 g  Status:  Discontinued     3.375 g 12.5 mL/hr over 240 Minutes  Intravenous Every 8 hours 08/25/17 1213 08/25/17 1428   08/25/17 1215  piperacillin-tazobactam (ZOSYN) IVPB 3.375 g  Status:  Discontinued     3.375 g 100 mL/hr over 30 Minutes Intravenous Every 6 hours 08/25/17 1209 08/25/17 1213   08/25/17 0300  ciprofloxacin (CIPRO) IVPB 400 mg  Status:  Discontinued     400 mg 200 mL/hr over 60 Minutes Intravenous Every 12 hours 08/25/17 0208 08/25/17 1208   08/25/17 0230  metroNIDAZOLE (FLAGYL) IVPB 500 mg  Status:  Discontinued     500 mg 100 mL/hr over 60 Minutes Intravenous Every 8 hours 08/25/17 0128 08/25/17 1208   08/24/17 2345  piperacillin-tazobactam (ZOSYN) IVPB 3.375 g     3.375 g 100 mL/hr over 30 Minutes  Intravenous  Once 08/24/17 2335 08/25/17 0055      Assessment: 56 year old male with K 3.0 and Mg 1.4 today requiring replacement. Received KCl IV 34mEq x 3 doses yesterday.   Goal of Therapy:  Electrolytes WNL (K 3.5 - 5.1)  Plan:  Will replace potassium with KCl IV 25meq q1h x 4 doses Will replace magnesium with Mg Sulfate IV 4g x once  Recheck BMP with AM labs. Also check Mg+ with AM labs  10/26 KCl 40meq x 1 dose oral, repeat bmp tomorrow.    Ladan Vanderzanden pharmD  08/29/2017,11:28 AM

## 2017-08-29 NOTE — Progress Notes (Signed)
Patient is alert and oriented and able to verbalize needs. No complaints of pain at this time. VSS. PICC removed. Discharge instructions gone over with patient and family at this time. Printed AVS and rx given to patient. F/U appts made. No concerns voiced at this time. Family to transport patient home.   Bethann Punches, RN

## 2017-08-29 NOTE — Discharge Instructions (Signed)
Follow-up with primary care physician in one week Follow-up with endocrinologist in 2 weeks Follow-up with gastroenterology in 1 week
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
## 2017-08-29 NOTE — Discharge Summary (Addendum)
[facility] [place] NAME: Carlos Mueller    MR#:  [id number]  DATE OF BIRTH:  October 01, 1961  DATE OF ADMISSION:  08/24/2017 ADMITTING PHYSICIAN: Lance Coon, MD  DATE OF DISCHARGE: 08/29/17  PRIMARY CARE PHYSICIAN: Patient, No Pcp Per    ADMISSION DIAGNOSIS:  Hematochezia [K92.1] Colitis [K52.9] Pneumatosis intestinalis [K63.89] Sepsis, due to unspecified organism ([place]) [A41.9]  DISCHARGE DIAGNOSIS:  Principal Problem:   Indeterminate colitis Active Problems:   Type 1 diabetes ([place])   Hypothyroidism   Acute gastrointestinal hemorrhage   Hematochezia   Pneumatosis intestinalis   Colitis   SECONDARY DIAGNOSIS:   Past Medical History:  Diagnosis Date  . Hypothyroidism   . Lumbar vertebral fracture (HCC)   . 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 Type 1 diabetes [facility])     HOSPITAL COURSE:   HPI  Carlos Mueller  is a 56 y.o. male who presents with abdominal pain for the past couple of days, increasing diarrhea, today's diarrhea became bloody. Here in the ED his had several episodes of bloody diarrhea. Workup shows leukocytosis, CT scan shows colitis. Hospitalists were called for admission  #Acute Colitis -unclear etiology Clinically improving, closely monitor C. difficile was negative, GI panel was negative  IV antibiotics Zosyn is changed to Unasyn, patient has completed antibiotic course; discontinue antibiotics per ID recommendations  CT scan showed some pneumatosis so surgery was consulted, recommending IV antibiotics and no surgical interventions at this time Clear liquid diet advanced to soft diet, continue soft diet for 1 week and then advance as tolerated Discharge with the by mouth Percocet to take as needed prescribing 30 pills with no refills  #Recent history of listeria meningitis Patient is on IV ampicillin until this Thursday; changed antibiotics to IV Unasyn ; patient has completed antibiotic course will  discontinue antibiotics and remove PICC line  #GI bleed - from acute colitis  Improved clinically   Hb 11.1-10.6-9.6.-9.2, --9.6 -9.2 tGI is following,  patient will be benefited with flexible sigmoidoscopy versus colonoscopy once clinically stable, outpatient follow-up with gastroenterology  #Hypo kalemia, hyponatremia Replete and recheck Hyponatremia improved sodium at 130 Encourage by mouth intake   #Type 1 diabetes ([place]) -with hypoglycemia sliding scale insulin with corresponding glucose checks Levemir dose decreased to 10 units bid NovoLog 2 units 3 times a day with meals Sliding scale   #Hypothyroidism - home dose thyroid replacement  Physical therapy recommends home health PT DISCHARGE CONDITIONS:   stable  CONSULTS OBTAINED:  Treatment Team:  Vickie Epley, MD [name], MD Leonel Ramsay, MD   PROCEDURES none  DRUG ALLERGIES:  No Known Allergies  DISCHARGE MEDICATIONS:   Current Discharge Medication List    START taking these medications   Details  acetaminophen (TYLENOL) 325 MG tablet Take 2 tablets (650 mg total) by mouth every 6 (six) hours as needed for mild pain (or Fever >/= 101).    pantoprazole (PROTONIX) 40 MG tablet Take 1 tablet (40 mg total) by mouth daily. Qty: 20 tablet, Refills: 0      CONTINUE these medications which have CHANGED   Details  divalproex (DEPAKOTE) 500 MG DR tablet Take 1 tablet (500 mg total) by mouth 2 (two) times daily. Qty: 60 tablet, Refills: 0    insulin detemir (LEVEMIR) 100 UNIT/ML injection Inject 0.1 mLs (10 Units total) into the skin 2 (two) times daily. Qty: 100 mL, Refills: 1    insulin lispro (HUMALOG) 100 UNIT/ML injection Inject  0.02 mLs (2 Units total) into the skin 3 (three) times daily before meals. Qty: 100 mL, Refills: 0    oxyCODONE (OXY IR/ROXICODONE) 5 MG immediate release tablet Take 1 tablet (5 mg total) by mouth every 6 (six) hours as needed for severe pain. Qty: 30  tablet, Refills: 0      CONTINUE these medications which have NOT CHANGED   Details  calcium carbonate (OSCAL) 1500 (600 Ca) MG TABS tablet Take 600 mg by mouth daily with breakfast.    Cyanocobalamin 1000 MCG TBCR Take 1,000 mcg by mouth daily.    insulin aspart (NOVOLOG FLEXPEN) 100 UNIT/ML FlexPen Inject 0-5 Units into the skin 3 (three) times daily with meals. If blood sugar is less than 60, call MD If blood sugar is 200-250, give 1 unit If blood sugar is 251-300, give 2 unit If blood sugar is 301-350, give 3 unit If blood sugar is 351-400, give 4 unit If blood sugar is 401-450, give 5 unit If blood sugar is greater than 450, call MD    levothyroxine (SYNTHROID, LEVOTHROID) 125 MCG tablet TAKE ONE TABLET BY MOUTH EVERY MORNING BEFORE BREAKFAST] Refills: 0    Multiple Vitamin (MULTIVITAMIN WITH MINERALS) TABS tablet Take 1 tablet by mouth daily.    Vitamin D, Ergocalciferol, (DRISDOL) 50000 units CAPS capsule Take 50,000 Units by mouth every 7 (seven) days. Patient takes on Friday      STOP taking these medications     ampicillin (OMNIPEN) 2 g injection      heparin flush 10 UNIT/ML SOLN injection      senna (SENOKOT) 8.6 MG TABS tablet      sodium chloride 0.9 % injection          DISCHARGE INSTRUCTIONS:    Follow-up with primary care physician in one week Follow-up with endocrinologist in 2 weeks Follow-up with gastroenterology in 1 week   DIET:  Soft diet for 1 week and then advance to diabetic diet  DISCHARGE CONDITION:  Fair  ACTIVITY:  Activity as tolerated  OXYGEN:  Home Oxygen: No.   Oxygen Delivery: room air  DISCHARGE LOCATION:  home   If you experience worsening of your admission symptoms, develop shortness of breath, life threatening emergency, suicidal or homicidal thoughts you must seek medical attention immediately by calling 911 or calling your MD immediately  if symptoms less severe.  You Must read complete instructions/literature  along with all the possible adverse reactions/side effects for all the Medicines you take and that have been prescribed to you. Take any new Medicines after you have completely understood and accpet all the possible adverse reactions/side effects.   Please note  You were cared for by a hospitalist during your hospital stay. If you have any questions about your discharge medications or the care you received while you were in the hospital after you are discharged, you can call the unit and asked to speak with the hospitalist on call if the hospitalist that took care of you is not available. Once you are discharged, your primary care physician will handle any further medical issues. Please note that NO REFILLS for any discharge medications will be authorized once you are discharged, as it is imperative that you return to your primary care physician (or establish a relationship with a primary care physician if you do not have one) for your aftercare needs so that they can reassess your need for medications and monitor your lab values.     Today  Chief Complaint  Patient presents with  . Abdominal Pain  . Rectal Bleeding   Patient denies any more episodes of GI bleed. Tolerating soft diet. Abdominal pain significantly improved. Wants to go home.  ROS:  CONSTITUTIONAL: Denies fevers, chills. Denies any fatigue, weakness.  EYES: Denies blurry vision, double vision, eye pain. EARS, NOSE, THROAT: Denies tinnitus, ear pain, hearing loss. RESPIRATORY: Denies cough, wheeze, shortness of breath.  CARDIOVASCULAR: Denies chest pain, palpitations, edema.  GASTROINTESTINAL: Denies nausea, vomiting, diarrhea, abdominal pain. Denies bright red blood per rectum. GENITOURINARY: Denies dysuria, hematuria. ENDOCRINE: Denies nocturia or thyroid problems. HEMATOLOGIC AND LYMPHATIC: Denies easy bruising or bleeding. SKIN: Denies rash or lesion. MUSCULOSKELETAL: Denies pain in neck, back, shoulder, knees, hips or  arthritic symptoms.  NEUROLOGIC: Denies paralysis, paresthesias.  PSYCHIATRIC: Denies anxiety or depressive symptoms.   VITAL SIGNS:  Blood pressure 115/67, pulse 74, temperature 98.4 F (36.9 C), temperature source Oral, resp. rate 19, height 5\' 8"  (1.727 m), weight 50.8 kg (112 lb 1.6 oz), SpO2 98 %.  I/O:    Intake/Output Summary (Last 24 hours) at 08/29/17 1632 Last data filed at 08/29/17 1335  Gross per 24 hour  Intake             1100 ml  Output                0 ml  Net             1100 ml    PHYSICAL EXAMINATION:  GENERAL:  56 y.o.-year-old patient lying in the bed with no acute distress.  EYES: Pupils equal, round, reactive to light and accommodation. No scleral icterus. Extraocular muscles intact.  HEENT: Head atraumatic, normocephalic. Oropharynx and nasopharynx clear.  NECK:  Supple, no jugular venous distention. No thyroid enlargement, no tenderness.  LUNGS: Normal breath sounds bilaterally, no wheezing, rales,rhonchi or crepitation. No use of accessory muscles of respiration.  CARDIOVASCULAR: S1, S2 normal. No murmurs, rubs, or gallops.  ABDOMEN: Soft, non-tender, non-distended. Bowel sounds present. No organomegaly or mass.  EXTREMITIES: No pedal edema, cyanosis, or clubbing.  NEUROLOGIC: Cranial nerves II through XII are intact. Muscle strength 5/5 in all extremities. Sensation intact. Gait not checked.  PSYCHIATRIC: The patient is alert and oriented x 3.  SKIN: No obvious rash, lesion, or ulcer.   DATA REVIEW:   CBC  Recent Labs Lab 08/29/17 0413  WBC 14.4*  HGB 9.2*  HCT 28.1*  PLT 423    Chemistries   Recent Labs Lab 08/24/17 2003  08/29/17 0413  NA 132*  < > 130*  K 3.6  < > 3.4*  CL 99*  < > 95*  CO2 27  < > 30  GLUCOSE 164*  < > 253*  BUN 11  < > <5*  CREATININE 0.56*  < > 0.69  CALCIUM 8.7*  < > 7.1*  MG  --   < > 1.8  AST 21  --   --   ALT 19  --   --   ALKPHOS 74  --   --   BILITOT 0.5  --   --   < > = values in this interval  not displayed.  Cardiac Enzymes No results for input(s): TROPONINI in the last 168 hours.  Microbiology Results  Results for orders placed or performed during the hospital encounter of 08/24/17  C difficile quick scan w PCR reflex     Status: None   Collection Time: 08/24/17  8:26 PM  Result Value Ref Range Status  C Diff antigen NEGATIVE NEGATIVE Final   C Diff toxin NEGATIVE NEGATIVE Final   C Diff interpretation No C. difficile detected.  Final  Gastrointestinal Panel by PCR , Stool     Status: None   Collection Time: 08/24/17  8:35 PM  Result Value Ref Range Status   Campylobacter species NOT DETECTED NOT DETECTED Final   Plesimonas shigelloides NOT DETECTED NOT DETECTED Final   Salmonella species NOT DETECTED NOT DETECTED Final   Yersinia enterocolitica NOT DETECTED NOT DETECTED Final   Vibrio species NOT DETECTED NOT DETECTED Final   Vibrio cholerae NOT DETECTED NOT DETECTED Final   Enteroaggregative E coli (EAEC) NOT DETECTED NOT DETECTED Final   Enteropathogenic E coli (EPEC) NOT DETECTED NOT DETECTED Final   Enterotoxigenic E coli (ETEC) NOT DETECTED NOT DETECTED Final   Shiga like toxin producing E coli (STEC) NOT DETECTED NOT DETECTED Final   Shigella/Enteroinvasive E coli (EIEC) NOT DETECTED NOT DETECTED Final   Cryptosporidium NOT DETECTED NOT DETECTED Final   Cyclospora cayetanensis NOT DETECTED NOT DETECTED Final   Entamoeba histolytica NOT DETECTED NOT DETECTED Final   Giardia lamblia NOT DETECTED NOT DETECTED Final   Adenovirus F40/41 NOT DETECTED NOT DETECTED Final   Astrovirus NOT DETECTED NOT DETECTED Final   Norovirus GI/GII NOT DETECTED NOT DETECTED Final   Rotavirus A NOT DETECTED NOT DETECTED Final   Sapovirus (I, II, IV, and V) NOT DETECTED NOT DETECTED Final    RADIOLOGY:  Dg Pelvis 1-2 Views  Result Date: 08/29/2017 CLINICAL DATA:  Fall. EXAM: PELVIS - 1-2 VIEW COMPARISON:  None. FINDINGS: There is no evidence of pelvic fracture or diastasis. No  pelvic bone lesions are seen. L4 fracture incompletely evaluated. IMPRESSION: Negative pelvis. Electronically Signed   By: Staci Righter M.D.   On: 08/29/2017 09:13    EKG:   Orders placed or performed during the hospital encounter of 06/28/17  . ED EKG  . ED EKG  . EKG 12-Lead  . EKG 12-Lead  . ED EKG  . ED EKG      Management plans discussed with the patient, family and they are in agreement.  CODE STATUS:     Code Status Orders        Start     Ordered   08/25/17 0129  Full code  Continuous     08/25/17 0128    Code Status History    Date Active Date Inactive Code Status Order ID Comments User Context   06/28/2017  3:38 PM 07/04/2017  7:33 PM Full Code [id number]  [name], MD Inpatient   06/28/2017  2:31 PM 06/28/2017  3:38 PM Full Code [id number]  [name], MD ED      TOTAL TIME TAKING CARE OF THIS PATIENT: 45  minutes.   Note: This dictation was prepared with Dragon dictation along with smaller phrase technology. Any transcriptional errors that result from this process are unintentional.   @[facility] @  on 08/29/2017 at 4:32 PM  Between 7am to 6pm - Pager - [phone]  After 6pm go to [email or web address] - password [email or web address] [facility]  Office  [phone]  CC: Primary care physician; Patient, No Pcp Per

## 2017-08-29 NOTE — Progress Notes (Signed)
Inpatient Diabetes Program Recommendations  AACE/ADA: New Consensus Statement on Inpatient Glycemic Control (2015)  Target Ranges:  Prepandial:   less than 140 mg/dL      Peak postprandial:   less than 180 mg/dL (1-2 hours)      Critically ill patients:  140 - 180 mg/dL   Lab Results  Component Value Date   GLUCAP 245 (H) 08/28/2017   HGBA1C 10.8 (H) 06/29/2017    Review of Glycemic Control  Results for Carlos Mueller, Carlos Mueller (MRN [id number]) as of 08/29/2017 09:05  Ref. Range 08/28/2017 11:50 08/28/2017 16:28 08/28/2017 21:14 08/28/2017 21:31 08/29/2017 07:58  Glucose-Capillary Latest Ref Range: 65 - 99 mg/dL 267 (H) 205 (H) 216 (H) 245 (H) 240 (H)    Diabetes history:DM7 (dx at 56 years old; makes no insulin and will require basal, correction, and meal coverage insulin)  Outpatient Diabetes medications: Levemir 21 units QAM, Levemir 18 units QPM, Novolog 0-5 units TID with meals  Current orders for Inpatient glycemic control: Levemir 10 units daily, Novolog 0-9 units tid and hs  Inpatient Diabetes Program Recommendations:soft diet ordered- patient has Type 1 diabetes - please order mealtime insulin Novolog 2 units tid with meals (this will ensure he always gets insulin when he eats) and a   Custom Novolog correction scale 0-5 units       151-200  1 unit      201-250  2 units      251-300  3 units      301-350  4 units      351-400  5 units  Consider increasing Levemir to 10 units bid.  Elevated CBG because patient is not getting mealtime insulin, only enough insulin to "correct" his high blood sugars.  Gentry Fitz, RN, BA, MHA, CDE Diabetes Coordinator Inpatient Diabetes Program  [phone] (Team Pager) [phone] ([place]) 08/29/2017 7:53 AM

## 2017-08-29 NOTE — Progress Notes (Signed)
PT is recommending home health. Patient will D/C home with home health to his sister Joanne's house. Patient and sister confirmed this plan and don't have home health preference. RN case manager aware of above. Please reconsult if future social work needs arise. CSW signing off.   [employer], LCSW [phone]

## 2017-08-29 NOTE — Care Management (Signed)
Spoke with patient sister. He has no PCP. Has only seen an endocrinologist lately. Explained that he could not get PT without a PCP. Patient walked 300 feet with PT. He has a walker at home. Will be staying with his sister who will be with patient.

## 2017-09-09 ENCOUNTER — Encounter: Payer: Self-pay | Admitting: Gastroenterology

## 2017-09-09 ENCOUNTER — Ambulatory Visit: Payer: 59 | Admitting: Gastroenterology

## 2017-09-17 ENCOUNTER — Other Ambulatory Visit: Payer: Self-pay | Admitting: Orthopedic Surgery

## 2017-09-17 DIAGNOSIS — S32040A Wedge compression fracture of fourth lumbar vertebra, initial encounter for closed fracture: Secondary | ICD-10-CM

## 2017-09-18 ENCOUNTER — Ambulatory Visit
Admission: RE | Admit: 2017-09-18 | Discharge: 2017-09-18 | Disposition: A | Discharge status: Home / Self Care | Payer: 59 | Source: Ambulatory Visit | Attending: Orthopedic Surgery | Admitting: Orthopedic Surgery

## 2017-09-18 DIAGNOSIS — S32040A Wedge compression fracture of fourth lumbar vertebra, initial encounter for closed fracture: Secondary | ICD-10-CM | POA: Diagnosis not present

## 2017-09-18 DIAGNOSIS — M5431 Sciatica, right side: Secondary | ICD-10-CM | POA: Insufficient documentation

## 2017-09-18 DIAGNOSIS — X58XXXA Exposure to other specified factors, initial encounter: Secondary | ICD-10-CM | POA: Insufficient documentation

## 2017-09-18 DIAGNOSIS — M47896 Other spondylosis, lumbar region: Secondary | ICD-10-CM | POA: Insufficient documentation

## 2017-09-18 DIAGNOSIS — M5432 Sciatica, left side: Secondary | ICD-10-CM | POA: Diagnosis not present

## 2017-09-19 ENCOUNTER — Telehealth: Payer: Self-pay | Admitting: Gastroenterology

## 2017-09-19 NOTE — Telephone Encounter (Signed)
*  STAT* If patient is at the pharmacy, call can be transferred to refill team.   1. Which medications need to be refilled? (please list name of each medication and dose if known) Protonix 40 mg  2. Which pharmacy/location (including street and city if local pharmacy) is medication to be sent to? [place]  3. Do they need a 30 day or 90 day supply? 30 day  How long does the patient need to take this Rx?

## 2017-09-23 ENCOUNTER — Telehealth: Payer: Self-pay | Admitting: Gastroenterology

## 2017-09-23 NOTE — Telephone Encounter (Signed)
Spoke with patient and he does not want to reschedule his appt with Dr. Allen Norris.

## 2017-09-23 NOTE — Telephone Encounter (Signed)
Can you call this pt back and advise that he will need to be seen in office before Dr. Allen Norris will prescribe the protonix. He was seen in the ER. He had a 1 week follow up with [facility] but was a no show. Dr. Allen Norris didn't even write for the medication.

## 2017-10-30 ENCOUNTER — Telehealth: Payer: Self-pay | Admitting: Nurse Practitioner

## 2017-10-30 NOTE — Telephone Encounter (Signed)
Made in error

## 2017-11-17 ENCOUNTER — Inpatient Hospital Stay: Admission: RE | Admit: 2017-11-17 | Payer: 59 | Source: Ambulatory Visit

## 2017-11-17 NOTE — Pre-Procedure Instructions (Signed)
Patient called requesting instructions for what meds to take in am prior to Kyphoplasty with Dr. Rudene Christians.  Reviewed pt's meds with him and determined he is to take 1/2 dose of Levemir insulin tonight and no Levemir or Humalog insulin tomorrow, to take his Depakote, Synthroid and Protonix tomorrow am and an extra dose of Protonix at bedtime tonight.  Pt repeated the above instructions correctly.

## 2017-11-18 ENCOUNTER — Ambulatory Visit
Admission: RE | Admit: 2017-11-18 | Discharge: 2017-11-18 | Disposition: A | Discharge status: Home / Self Care | Payer: BLUE CROSS/BLUE SHIELD | Source: Ambulatory Visit | Attending: Orthopedic Surgery | Admitting: Orthopedic Surgery

## 2017-11-18 ENCOUNTER — Ambulatory Visit: Payer: BLUE CROSS/BLUE SHIELD | Admitting: Anesthesiology

## 2017-11-18 ENCOUNTER — Encounter
Admission: RE | Disposition: A | Discharge status: Home / Self Care | Payer: Self-pay | Source: Ambulatory Visit | Attending: Orthopedic Surgery

## 2017-11-18 ENCOUNTER — Ambulatory Visit: Payer: BLUE CROSS/BLUE SHIELD

## 2017-11-18 ENCOUNTER — Encounter: Payer: Self-pay | Admitting: Emergency Medicine

## 2017-11-18 DIAGNOSIS — Z79899 Other long term (current) drug therapy: Secondary | ICD-10-CM | POA: Insufficient documentation

## 2017-11-18 DIAGNOSIS — Z8661 Personal history of infections of the central nervous system: Secondary | ICD-10-CM | POA: Insufficient documentation

## 2017-11-18 DIAGNOSIS — M5432 Sciatica, left side: Secondary | ICD-10-CM | POA: Diagnosis not present

## 2017-11-18 DIAGNOSIS — Z794 Long term (current) use of insulin: Secondary | ICD-10-CM | POA: Insufficient documentation

## 2017-11-18 DIAGNOSIS — E559 Vitamin D deficiency, unspecified: Secondary | ICD-10-CM | POA: Diagnosis not present

## 2017-11-18 DIAGNOSIS — X58XXXA Exposure to other specified factors, initial encounter: Secondary | ICD-10-CM | POA: Insufficient documentation

## 2017-11-18 DIAGNOSIS — E109 Type 1 diabetes mellitus without complications: Secondary | ICD-10-CM | POA: Insufficient documentation

## 2017-11-18 DIAGNOSIS — S32040A Wedge compression fracture of fourth lumbar vertebra, initial encounter for closed fracture: Secondary | ICD-10-CM | POA: Diagnosis present

## 2017-11-18 DIAGNOSIS — R569 Unspecified convulsions: Secondary | ICD-10-CM | POA: Insufficient documentation

## 2017-11-18 DIAGNOSIS — E039 Hypothyroidism, unspecified: Secondary | ICD-10-CM | POA: Diagnosis not present

## 2017-11-18 DIAGNOSIS — Z419 Encounter for procedure for purposes other than remedying health state, unspecified: Secondary | ICD-10-CM

## 2017-11-18 HISTORY — PX: KYPHOPLASTY: SHX5884

## 2017-11-18 LAB — GLUCOSE, CAPILLARY
GLUCOSE-CAPILLARY: 442 mg/dL — AB (ref 65–99)
GLUCOSE-CAPILLARY: 340 mg/dL — AB (ref 65–99)
Glucose-Capillary: 425 mg/dL — ABNORMAL HIGH (ref 65–99)
Glucose-Capillary: 166 mg/dL — ABNORMAL HIGH (ref 65–99)
Glucose-Capillary: 100 mg/dL — ABNORMAL HIGH (ref 65–99)
Glucose-Capillary: 91 mg/dL (ref 65–99)

## 2017-11-18 SURGERY — KYPHOPLASTY
Anesthesia: Monitor Anesthesia Care | Wound class: Clean

## 2017-11-18 MED ORDER — IOPAMIDOL (ISOVUE-M 200) INJECTION 41%
INTRAMUSCULAR | Status: AC
  Filled 2017-11-18: qty 20

## 2017-11-18 MED ORDER — FENTANYL CITRATE (PF) 100 MCG/2ML IJ SOLN
INTRAMUSCULAR | Status: DC | PRN
  Administered 2017-11-18 (×4): 25 ug via INTRAVENOUS

## 2017-11-18 MED ORDER — KETAMINE HCL 50 MG/ML IJ SOLN
INTRAMUSCULAR | Status: AC
  Filled 2017-11-18: qty 10

## 2017-11-18 MED ORDER — HYDROCODONE-ACETAMINOPHEN 5-325 MG PO TABS: 1.0000 | ORAL_TABLET | Freq: Four times a day (QID) | ORAL | 0 refills | Status: DC | PRN

## 2017-11-18 MED ORDER — BUPIVACAINE-EPINEPHRINE (PF) 0.5% -1:200000 IJ SOLN
INTRAMUSCULAR | Status: DC | PRN
  Administered 2017-11-18: 20 mL

## 2017-11-18 MED ORDER — PROPOFOL 500 MG/50ML IV EMUL: INTRAVENOUS | Status: DC | PRN

## 2017-11-18 MED ORDER — ONDANSETRON HCL 4 MG/2ML IJ SOLN
INTRAMUSCULAR | Status: DC | PRN
  Administered 2017-11-18: 4 mg via INTRAVENOUS

## 2017-11-18 MED ORDER — MIDAZOLAM HCL 2 MG/2ML IJ SOLN
INTRAMUSCULAR | Status: DC | PRN
  Administered 2017-11-18: 2 mg via INTRAVENOUS

## 2017-11-18 MED ORDER — FENTANYL CITRATE (PF) 100 MCG/2ML IJ SOLN
25.0000 ug | INTRAMUSCULAR | Status: DC | PRN
Start: 2017-11-18 — End: 2017-11-18

## 2017-11-18 MED ORDER — INSULIN ASPART 100 UNIT/ML ~~LOC~~ SOLN
SUBCUTANEOUS | Status: AC
  Administered 2017-11-18: 5 [IU] via SUBCUTANEOUS
  Filled 2017-11-18: qty 1

## 2017-11-18 MED ORDER — LIDOCAINE HCL (PF) 1 % IJ SOLN
INTRAMUSCULAR | Status: AC
  Filled 2017-11-18: qty 60

## 2017-11-18 MED ORDER — METOCLOPRAMIDE HCL 10 MG PO TABS: 5.0000 mg | ORAL_TABLET | Freq: Three times a day (TID) | ORAL | Status: DC | PRN

## 2017-11-18 MED ORDER — ONDANSETRON HCL 4 MG/2ML IJ SOLN: 4.0000 mg | Freq: Four times a day (QID) | INTRAMUSCULAR | Status: DC | PRN

## 2017-11-18 MED ORDER — CEFAZOLIN SODIUM-DEXTROSE 1-4 GM/50ML-% IV SOLN
INTRAVENOUS | Status: AC
  Filled 2017-11-18: qty 50

## 2017-11-18 MED ORDER — METOCLOPRAMIDE HCL 5 MG/ML IJ SOLN: 5.0000 mg | Freq: Three times a day (TID) | INTRAMUSCULAR | Status: DC | PRN

## 2017-11-18 MED ORDER — PROPOFOL 10 MG/ML IV BOLUS
INTRAVENOUS | Status: DC | PRN
  Administered 2017-11-18 (×3): 20 mg via INTRAVENOUS

## 2017-11-18 MED ORDER — INSULIN ASPART 100 UNIT/ML ~~LOC~~ SOLN
10.0000 [IU] | Freq: Once | SUBCUTANEOUS | Status: AC
  Administered 2017-11-18: 10 [IU] via SUBCUTANEOUS

## 2017-11-18 MED ORDER — LACTATED RINGERS IV SOLN
INTRAVENOUS | Status: DC | PRN
  Administered 2017-11-18: 13:00:00 via INTRAVENOUS

## 2017-11-18 MED ORDER — LIDOCAINE HCL (PF) 1 % IJ SOLN
INTRAMUSCULAR | Status: DC | PRN
Start: 2017-11-18 — End: 2017-11-18
  Administered 2017-11-18: 30 mL

## 2017-11-18 MED ORDER — INSULIN ASPART 100 UNIT/ML ~~LOC~~ SOLN
5.0000 [IU] | Freq: Once | SUBCUTANEOUS | Status: AC
  Administered 2017-11-18: 5 [IU] via SUBCUTANEOUS

## 2017-11-18 MED ORDER — HYDROMORPHONE HCL 1 MG/ML IJ SOLN
INTRAMUSCULAR | Status: AC
  Filled 2017-11-18: qty 1

## 2017-11-18 MED ORDER — KETAMINE HCL 100 MG/ML IJ SOLN: INTRAMUSCULAR | Status: DC | PRN

## 2017-11-18 MED ORDER — FENTANYL CITRATE (PF) 100 MCG/2ML IJ SOLN
INTRAMUSCULAR | Status: AC
  Filled 2017-11-18: qty 2

## 2017-11-18 MED ORDER — KETAMINE HCL 100 MG/ML IJ SOLN
INTRAMUSCULAR | Status: DC | PRN
  Administered 2017-11-18: 10 mg via INTRAVENOUS

## 2017-11-18 MED ORDER — INSULIN ASPART 100 UNIT/ML ~~LOC~~ SOLN
SUBCUTANEOUS | Status: AC
  Administered 2017-11-18: 10 [IU] via SUBCUTANEOUS
  Filled 2017-11-18: qty 1

## 2017-11-18 MED ORDER — SODIUM CHLORIDE 0.9 % IV SOLN: INTRAVENOUS | Status: DC

## 2017-11-18 MED ORDER — PROPOFOL 10 MG/ML IV BOLUS
INTRAVENOUS | Status: AC
Start: 2017-11-18 — End: ?
  Filled 2017-11-18: qty 20

## 2017-11-18 MED ORDER — SODIUM CHLORIDE 0.9 % IV SOLN
INTRAVENOUS | Status: DC
  Administered 2017-11-18: 11:00:00 via INTRAVENOUS

## 2017-11-18 MED ORDER — ONDANSETRON HCL 4 MG/2ML IJ SOLN
INTRAMUSCULAR | Status: AC
  Filled 2017-11-18: qty 2

## 2017-11-18 MED ORDER — BUPIVACAINE-EPINEPHRINE (PF) 0.5% -1:200000 IJ SOLN
INTRAMUSCULAR | Status: AC
  Filled 2017-11-18: qty 30

## 2017-11-18 MED ORDER — MIDAZOLAM HCL 2 MG/2ML IJ SOLN
INTRAMUSCULAR | Status: AC
  Filled 2017-11-18: qty 2

## 2017-11-18 MED ORDER — HYDROCODONE-ACETAMINOPHEN 5-325 MG PO TABS: 1.0000 | ORAL_TABLET | ORAL | Status: DC | PRN

## 2017-11-18 MED ORDER — LIDOCAINE HCL (CARDIAC) 20 MG/ML IV SOLN
INTRAVENOUS | Status: DC | PRN
  Administered 2017-11-18: 60 mg via INTRAVENOUS

## 2017-11-18 MED ORDER — LIDOCAINE HCL (PF) 2 % IJ SOLN
INTRAMUSCULAR | Status: AC
  Filled 2017-11-18: qty 10

## 2017-11-18 MED ORDER — ONDANSETRON HCL 4 MG/2ML IJ SOLN: 4.0000 mg | Freq: Once | INTRAMUSCULAR | Status: DC | PRN

## 2017-11-18 MED ORDER — ONDANSETRON HCL 4 MG PO TABS: 4.0000 mg | ORAL_TABLET | Freq: Four times a day (QID) | ORAL | Status: DC | PRN

## 2017-11-18 MED ORDER — CEFAZOLIN SODIUM-DEXTROSE 1-4 GM/50ML-% IV SOLN
1.0000 g | Freq: Once | INTRAVENOUS | Status: AC
  Administered 2017-11-18: 1 g via INTRAVENOUS

## 2017-11-18 SURGICAL SUPPLY — 16 items
CEMENT BONE KYPHON CDS (Cement) ×3 IMPLANT
DERMABOND ADVANCED (GAUZE/BANDAGES/DRESSINGS) ×2
DERMABOND ADVANCED .7 DNX12 (GAUZE/BANDAGES/DRESSINGS) ×1 IMPLANT
DEVICE BIOPSY BONE KYPHX (INSTRUMENTS) ×3 IMPLANT
DRAPE C-ARM XRAY 36X54 (DRAPES) ×3 IMPLANT
DURAPREP 26ML APPLICATOR (WOUND CARE) ×3 IMPLANT
GLOVE SURG SYN 9.0  PF PI (GLOVE) ×2
GLOVE SURG SYN 9.0 PF PI (GLOVE) ×1 IMPLANT
GOWN SRG 2XL LVL 4 RGLN SLV (GOWNS) ×1 IMPLANT
GOWN STRL NON-REIN 2XL LVL4 (GOWNS) ×2
GOWN STRL REUS W/ TWL LRG LVL3 (GOWN DISPOSABLE) ×1 IMPLANT
GOWN STRL REUS W/TWL LRG LVL3 (GOWN DISPOSABLE) ×2
PACK KYPHOPLASTY (MISCELLANEOUS) ×3 IMPLANT
STRAP SAFETY BODY (MISCELLANEOUS) ×3 IMPLANT
TRAY KYPHOPAK 15/3 EXPRESS 1ST (MISCELLANEOUS) IMPLANT
TRAY KYPHOPAK 20/3 EXPRESS 1ST (MISCELLANEOUS) ×3 IMPLANT

## 2017-11-18 NOTE — H&P (Signed)
Reviewed paper H+P, will be scanned into chart. No changes noted.  
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
## 2017-11-18 NOTE — Discharge Instructions (Addendum)
Take it easy today and tomorrow resume normal activities on Thursday. Remove Band-Aid's on Thursday then okay to shower. Pain medicine as directed   AMBULATORY SURGERY  DISCHARGE INSTRUCTIONS   1) The drugs that you were given will stay in your system until tomorrow so for the next 24 hours you should not:  A) Drive an automobile B) Make any legal decisions C) Drink any alcoholic beverage   2) You may resume regular meals tomorrow.  Today it is better to start with liquids and gradually work up to solid foods.  You may eat anything you prefer, but it is better to start with liquids, then soup and crackers, and gradually work up to solid foods.   3) Please notify your doctor immediately if you have any unusual bleeding, trouble breathing, redness and pain at the surgery site, drainage, fever, or pain not relieved by medication.    4) Additional Instructions:        Please contact your physician with any problems or Same Day Surgery at [phone], Monday through Friday 6 am to 4 pm, or [place] at [facility] Main number at [phone].

## 2017-11-18 NOTE — Anesthesia Post-op Follow-up Note (Signed)
Anesthesia QCDR form completed.        

## 2017-11-18 NOTE — Op Note (Signed)
11/18/2017  2:20 PM  PATIENT:  Carlos Mueller  57 y.o. male  PRE-OPERATIVE DIAGNOSIS:  CLOSED WEDGE COMPRESSION FRACTURE OF FOURTH LUMBAR VERTEBRA  POST-OPERATIVE DIAGNOSIS:  CLOSED WEDGE COMPRESSION FRACTURE OF FOURTH LUMBAR VERTEBRA  PROCEDURE:  Procedure(s) with comments: KYPHOPLASTY-L4 (N/A) - L4  SURGEON: Laurene Footman, MD  ASSISTANTS: None  ANESTHESIA:   local and MAC  EBL:  Total I/O In: 1000 [I.V.:1000] Out: 3 [Blood:3]  BLOOD ADMINISTERED:none  DRAINS: none   LOCAL MEDICATIONS USED:  MARCAINE    and LIDOCAINE   SPECIMEN:  Source of Specimen:  L4 vertebral body  DISPOSITION OF SPECIMEN:  PATHOLOGY  COUNTS:  YES  TOURNIQUET:  * No tourniquets in log *  IMPLANTS: Bone cement  DICTATION: .Dragon Dictation patient was brought the operating room and after adequate sedation was given the patient was placed prone. C-arm was brought in and good visualization of L4 was obtained that showed vertebral plana. After patient identification and timeout procedures were completed, local anesthetic was infiltrated around the skin in the area of the planned incisions. The back was then prepped and draped in usual sterile manner and repeat timeout procedure carried out. Spinal needle was used to get local anesthetic down to the pedicle on both sides with a 50-50 mix of 1% Xylocaine and half percent Sensorcaine with epinephrine 10 cc of each to both sides. After allowing this to set a small incision was made and trocar used and an extra pedicular fashion on the right transpedicular on the left to get into the vertebral body with biopsy obtained drilling was carried out followed by balloon inflation but correction did not occur as the fracture was subacute to chronic. After the cement was the appropriate consistency balloons were down and a proximally CC Nava cement was of treatment on both sides with some extravasation into the L4-5 disc space on the right side. No posterior anterior  extrusion and vasculature was noted. After the cement was set trochars removed and with Dermabond used to close the skin followed by Band-Aids  PLAN OF CARE: Discharge to home after PACU  PATIENT DISPOSITION:  PACU - hemodynamically stable.

## 2017-11-18 NOTE — OR Nursing (Signed)
Dr. Andree Elk notified of most recent blood sugar of 340.  He does not want this to be treated as he feels that the sugar is decreasing and will settle out to an acceptable level.

## 2017-11-18 NOTE — Anesthesia Preprocedure Evaluation (Signed)
Anesthesia Evaluation  Patient identified by MRN, date of birth, ID band Patient awake    Reviewed: Allergy & Precautions, H&P , NPO status , Patient's Chart, lab work & pertinent test results, reviewed documented beta blocker date and time   Airway Mallampati: II  TM Distance: >3 FB Neck ROM: full    Dental no notable dental hx. (+) Teeth Intact   Pulmonary neg pulmonary ROS,    Pulmonary exam normal breath sounds clear to auscultation       Cardiovascular Exercise Tolerance: Good hypertension, negative cardio ROS   Rhythm:regular Rate:Normal     Neuro/Psych negative neurological ROS  negative psych ROS   GI/Hepatic negative GI ROS, Neg liver ROS,   Endo/Other  negative endocrine ROSdiabetes  Renal/GU      Musculoskeletal   Abdominal   Peds  Hematology negative hematology ROS (+)   Anesthesia Other Findings   Reproductive/Obstetrics negative OB ROS                             Anesthesia Physical Anesthesia Plan  ASA: III  Anesthesia Plan: MAC   Post-op Pain Management:    Induction:   PONV Risk Score and Plan:   Airway Management Planned:   Additional Equipment:   Intra-op Plan:   Post-operative Plan:   Informed Consent: I have reviewed the patients History and Physical, chart, labs and discussed the procedure including the risks, benefits and alternatives for the proposed anesthesia with the patient or authorized representative who has indicated his/her understanding and acceptance.     Plan Discussed with: CRNA  Anesthesia Plan Comments:         Anesthesia Quick Evaluation  

## 2017-11-18 NOTE — OR Nursing (Signed)
OJ given between fsbs of 91 and 100, per patient "so it won't drop"

## 2017-11-18 NOTE — Transfer of Care (Signed)
Immediate Anesthesia Transfer of Care Note  Patient: Carlos Mueller  Procedure(s) Performed: Ronna Polio (N/A )  Patient Location: PACU  Anesthesia Type:General  Level of Consciousness: sedated  Airway & Oxygen Therapy: Patient Spontanous Breathing and Patient connected to nasal cannula oxygen  Post-op Assessment: Report given to RN and Post -op Vital signs reviewed and stable  Post vital signs: Reviewed and stable  Last Vitals:  Vitals:   11/18/17 1043 11/18/17 1425  BP: (!) 143/56 128/66  Pulse: (!) 104 94  Resp: 17 13  Temp: (!) 36.4 C   SpO2: 100% 100%    Last Pain:  Vitals:   11/18/17 1425  TempSrc:   PainSc: Asleep         Complications: No apparent anesthesia complications

## 2017-11-19 ENCOUNTER — Encounter: Payer: Self-pay | Admitting: Orthopedic Surgery

## 2017-11-20 LAB — SURGICAL PATHOLOGY

## 2017-11-20 NOTE — Anesthesia Postprocedure Evaluation (Signed)
Anesthesia Post Note  Patient: Georgeann Oppenheim  Procedure(s) Performed: Ronna Polio (N/A )  Patient location during evaluation: PACU Anesthesia Type: MAC Level of consciousness: awake and alert Pain management: pain level controlled Vital Signs Assessment: post-procedure vital signs reviewed and stable Respiratory status: spontaneous breathing, nonlabored ventilation, respiratory function stable and patient connected to nasal cannula oxygen Cardiovascular status: blood pressure returned to baseline and stable Postop Assessment: no apparent nausea or vomiting Anesthetic complications: no     Last Vitals:  Vitals:   11/18/17 1520 11/18/17 1604  BP: (!) 124/58 (!) 136/52  Pulse: 82 77  Resp: 14 16  Temp: 36.7 C 36.6 C  SpO2: 98% 100%    Last Pain:  Vitals:   11/18/17 1604  TempSrc: Temporal  PainSc:                  Molli Barrows

## 2018-04-24 DIAGNOSIS — E559 Vitamin D deficiency, unspecified: Secondary | ICD-10-CM | POA: Insufficient documentation

## 2020-01-10 ENCOUNTER — Other Ambulatory Visit: Payer: Self-pay | Admitting: Physician Assistant

## 2020-01-10 ENCOUNTER — Ambulatory Visit
Admission: RE | Admit: 2020-01-10 | Discharge: 2020-01-10 | Disposition: A | Discharge status: Home / Self Care | Payer: No Typology Code available for payment source | Source: Ambulatory Visit | Attending: Physician Assistant | Admitting: Physician Assistant

## 2020-01-10 ENCOUNTER — Other Ambulatory Visit: Payer: Self-pay

## 2020-01-10 DIAGNOSIS — Z8616 Personal history of COVID-19: Secondary | ICD-10-CM

## 2020-01-10 DIAGNOSIS — J9811 Atelectasis: Secondary | ICD-10-CM | POA: Insufficient documentation

## 2020-01-10 DIAGNOSIS — R0602 Shortness of breath: Secondary | ICD-10-CM | POA: Insufficient documentation

## 2020-01-10 LAB — POCT I-STAT CREATININE: Creatinine, Ser: 0.6 mg/dL — ABNORMAL LOW (ref 0.61–1.24)

## 2020-01-10 MED ORDER — IOHEXOL 350 MG/ML SOLN
75.0000 mL | Freq: Once | INTRAVENOUS | Status: AC | PRN
  Administered 2020-01-10: 11:00:00 75 mL via INTRAVENOUS

## 2020-07-21 ENCOUNTER — Emergency Department: Payer: No Typology Code available for payment source

## 2020-07-21 ENCOUNTER — Emergency Department
Admission: EM | Admit: 2020-07-21 | Discharge: 2020-07-21 | Disposition: A | Discharge status: Home / Self Care | Payer: No Typology Code available for payment source | Attending: Emergency Medicine | Admitting: Emergency Medicine

## 2020-07-21 ENCOUNTER — Encounter: Payer: Self-pay | Admitting: Radiology

## 2020-07-21 DIAGNOSIS — E109 Type 1 diabetes mellitus without complications: Secondary | ICD-10-CM | POA: Insufficient documentation

## 2020-07-21 DIAGNOSIS — Z794 Long term (current) use of insulin: Secondary | ICD-10-CM | POA: Diagnosis not present

## 2020-07-21 DIAGNOSIS — Z79890 Hormone replacement therapy (postmenopausal): Secondary | ICD-10-CM | POA: Diagnosis not present

## 2020-07-21 DIAGNOSIS — Z79899 Other long term (current) drug therapy: Secondary | ICD-10-CM | POA: Diagnosis not present

## 2020-07-21 DIAGNOSIS — R339 Retention of urine, unspecified: Secondary | ICD-10-CM

## 2020-07-21 DIAGNOSIS — N3289 Other specified disorders of bladder: Secondary | ICD-10-CM | POA: Diagnosis not present

## 2020-07-21 DIAGNOSIS — E162 Hypoglycemia, unspecified: Secondary | ICD-10-CM

## 2020-07-21 DIAGNOSIS — R4182 Altered mental status, unspecified: Secondary | ICD-10-CM | POA: Diagnosis not present

## 2020-07-21 DIAGNOSIS — E039 Hypothyroidism, unspecified: Secondary | ICD-10-CM | POA: Diagnosis not present

## 2020-07-21 DIAGNOSIS — R52 Pain, unspecified: Secondary | ICD-10-CM

## 2020-07-21 DIAGNOSIS — Z20822 Contact with and (suspected) exposure to covid-19: Secondary | ICD-10-CM | POA: Diagnosis not present

## 2020-07-21 DIAGNOSIS — I700 Atherosclerosis of aorta: Secondary | ICD-10-CM | POA: Diagnosis not present

## 2020-07-21 LAB — BLOOD GAS, VENOUS
Acid-Base Excess: 1.7 mmol/L (ref 0.0–2.0)
Bicarbonate: 26.6 mmol/L (ref 20.0–28.0)
O2 Saturation: 91.2 %
Patient temperature: 37
pCO2, Ven: 42 mmHg — ABNORMAL LOW (ref 44.0–60.0)
pH, Ven: 7.41 (ref 7.250–7.430)
pO2, Ven: 61 mmHg — ABNORMAL HIGH (ref 32.0–45.0)

## 2020-07-21 LAB — URINALYSIS, COMPLETE (UACMP) WITH MICROSCOPIC
Bacteria, UA: NONE SEEN
Bilirubin Urine: NEGATIVE
Glucose, UA: >=500 mg/dL — AB
Hgb urine dipstick: NEGATIVE
Ketones, ur: NEGATIVE mg/dL
Leukocytes,Ua: NEGATIVE
Nitrite: NEGATIVE
Protein, ur: NEGATIVE mg/dL
Specific Gravity, Urine: 1.009 (ref 1.005–1.030)
Squamous Epithelial / LPF: NONE SEEN (ref 0–5)
WBC, UA: NONE SEEN WBC/hpf (ref 0–5)
pH: 6 (ref 5.0–8.0)

## 2020-07-21 LAB — URINE DRUG SCREEN, QUALITATIVE (ARMC ONLY)
Amphetamines, Ur Screen: NOT DETECTED
Barbiturates, Ur Screen: NOT DETECTED
Benzodiazepine, Ur Scrn: NOT DETECTED
Cannabinoid 50 Ng, Ur ~~LOC~~: NOT DETECTED
Cocaine Metabolite,Ur ~~LOC~~: NOT DETECTED
MDMA (Ecstasy)Ur Screen: NOT DETECTED
Methadone Scn, Ur: NOT DETECTED
Opiate, Ur Screen: NOT DETECTED
Phencyclidine (PCP) Ur S: NOT DETECTED
Tricyclic, Ur Screen: NOT DETECTED

## 2020-07-21 LAB — TROPONIN I (HIGH SENSITIVITY)
Troponin I (High Sensitivity): 3 ng/L (ref <18)
Troponin I (High Sensitivity): 3 ng/L (ref <18)

## 2020-07-21 LAB — CBC WITH DIFFERENTIAL/PLATELET
Abs Immature Granulocytes: 0.05 10*3/uL (ref 0.00–0.07)
Basophils Absolute: 0 10*3/uL (ref 0.0–0.1)
Basophils Relative: 0 %
Eosinophils Absolute: 0.1 10*3/uL (ref 0.0–0.5)
Eosinophils Relative: 1 %
HCT: 39.5 % (ref 39.0–52.0)
Hemoglobin: 13.8 g/dL (ref 13.0–17.0)
Immature Granulocytes: 0 %
Lymphocytes Relative: 11 %
Lymphs Abs: 1.3 10*3/uL (ref 0.7–4.0)
MCH: 31.7 pg (ref 26.0–34.0)
MCHC: 34.9 g/dL (ref 30.0–36.0)
MCV: 90.8 fL (ref 80.0–100.0)
Monocytes Absolute: 0.8 10*3/uL (ref 0.1–1.0)
Monocytes Relative: 7 %
Neutro Abs: 9.1 10*3/uL — ABNORMAL HIGH (ref 1.7–7.7)
Neutrophils Relative %: 81 %
Platelets: 292 10*3/uL (ref 150–400)
RBC: 4.35 MIL/uL (ref 4.22–5.81)
RDW: 12.8 % (ref 11.5–15.5)
WBC: 11.4 10*3/uL — ABNORMAL HIGH (ref 4.0–10.5)
nRBC: 0 % (ref 0.0–0.2)

## 2020-07-21 LAB — ACETAMINOPHEN LEVEL: Acetaminophen (Tylenol), Serum: <10 ug/mL — ABNORMAL LOW (ref 10–30)

## 2020-07-21 LAB — COMPREHENSIVE METABOLIC PANEL
ALT: 27 U/L (ref 0–44)
AST: 22 U/L (ref 15–41)
Albumin: 3.9 g/dL (ref 3.5–5.0)
Alkaline Phosphatase: 56 U/L (ref 38–126)
Anion gap: 9 (ref 5–15)
BUN: 11 mg/dL (ref 6–20)
CO2: 25 mmol/L (ref 22–32)
Calcium: 8.5 mg/dL — ABNORMAL LOW (ref 8.9–10.3)
Chloride: 99 mmol/L (ref 98–111)
Creatinine, Ser: 0.65 mg/dL (ref 0.61–1.24)
GFR calc Af Amer: >60 mL/min (ref >60)
GFR calc non Af Amer: >60 mL/min (ref >60)
Glucose, Bld: 214 mg/dL — ABNORMAL HIGH (ref 70–99)
Potassium: 3.7 mmol/L (ref 3.5–5.1)
Sodium: 133 mmol/L — ABNORMAL LOW (ref 135–145)
Total Bilirubin: 0.6 mg/dL (ref 0.3–1.2)
Total Protein: 6.3 g/dL — ABNORMAL LOW (ref 6.5–8.1)

## 2020-07-21 LAB — GLUCOSE, CAPILLARY
Glucose-Capillary: 199 mg/dL — ABNORMAL HIGH (ref 70–99)
Glucose-Capillary: 219 mg/dL — ABNORMAL HIGH (ref 70–99)
Glucose-Capillary: 229 mg/dL — ABNORMAL HIGH (ref 70–99)

## 2020-07-21 LAB — SARS CORONAVIRUS 2 BY RT PCR (HOSPITAL ORDER, PERFORMED IN ~~LOC~~ HOSPITAL LAB): SARS Coronavirus 2: NEGATIVE

## 2020-07-21 LAB — SAMPLE TO BLOOD BANK

## 2020-07-21 LAB — SALICYLATE LEVEL: Salicylate Lvl: <7 mg/dL — ABNORMAL LOW (ref 7.0–30.0)

## 2020-07-21 LAB — LACTIC ACID, PLASMA
Lactic Acid, Venous: 0.8 mmol/L (ref 0.5–1.9)
Lactic Acid, Venous: 0.7 mmol/L (ref 0.5–1.9)

## 2020-07-21 LAB — ETHANOL: Alcohol, Ethyl (B): <10 mg/dL (ref <10)

## 2020-07-21 LAB — AMMONIA: Ammonia: 25 umol/L (ref 9–35)

## 2020-07-21 LAB — BETA-HYDROXYBUTYRIC ACID: Beta-Hydroxybutyric Acid: 0.19 mmol/L (ref 0.05–0.27)

## 2020-07-21 LAB — SEDIMENTATION RATE: Sed Rate: 1 mm/h (ref 0–20)

## 2020-07-21 LAB — TSH: TSH: 2.522 u[IU]/mL (ref 0.350–4.500)

## 2020-07-21 MED ORDER — SODIUM CHLORIDE 0.9 % IV SOLN: Freq: Once | INTRAVENOUS | Status: AC

## 2020-07-21 MED ORDER — TAMSULOSIN HCL 0.4 MG PO CAPS: 0.4000 mg | ORAL_CAPSULE | Freq: Every day | ORAL | 1 refills | Status: DC

## 2020-07-21 MED ORDER — KETOROLAC TROMETHAMINE 30 MG/ML IJ SOLN
15.0000 mg | Freq: Once | INTRAMUSCULAR | Status: AC
  Administered 2020-07-21: 15 mg via INTRAVENOUS
  Filled 2020-07-21: qty 1

## 2020-07-21 MED ORDER — DEXAMETHASONE SODIUM PHOSPHATE 10 MG/ML IJ SOLN
10.0000 mg | Freq: Once | INTRAMUSCULAR | Status: AC
  Administered 2020-07-21: 10 mg via INTRAVENOUS
  Filled 2020-07-21: qty 1

## 2020-07-21 MED ORDER — IOHEXOL 300 MG/ML  SOLN
100.0000 mL | Freq: Once | INTRAMUSCULAR | Status: AC | PRN
  Administered 2020-07-21: 100 mL via INTRAVENOUS

## 2020-07-21 MED ORDER — ACETAMINOPHEN 500 MG PO TABS
1000.0000 mg | ORAL_TABLET | Freq: Once | ORAL | Status: AC
  Administered 2020-07-21: 1000 mg via ORAL
  Filled 2020-07-21: qty 2

## 2020-07-21 NOTE — ED Notes (Signed)
Meal tray provided to pt.

## 2020-07-21 NOTE — ED Notes (Signed)
Pt returned from MRI. Warm blankets provided.

## 2020-07-21 NOTE — ED Notes (Signed)
Visitor of pt requesting something "to lie on". Visitor informed at this time patients are in all recliner chairs and beds. Explanation provided to visitor that ED is full at this time due to covid wait times as is nationwide. Visitor strates "just excuses, i'm sick of the covid excuses." visitor rolling eyes and cursing. Visitor states  "then get me something to lie on the floor". Blankets and  Pillow provided to visitor, visitor does not ackowledge recipt of items, continues to roll eyes and curse.

## 2020-07-21 NOTE — ED Notes (Signed)
Bladder scan >580ml. MD made aware

## 2020-07-21 NOTE — ED Provider Notes (Signed)
[facility] Emergency Department Provider Note   ____________________________________________   First MD Initiated Contact with Patient 07/21/20 1422     (approximate)  I have reviewed the triage vital signs and the nursing notes.   HISTORY  Chief Complaint Altered Mental Status  History limited by decreased responsiveness  HPI Carlos Mueller is a 59 y.o. male who comes in with decreased alertness.  He is almost unresponsive but on stimulating him by talking loud at him and rubbing his chest not doing a sternal rub just rubbing his chest he wakes up and he says his low back is been hurting for a while and is bothering him a lot.  Then he goes back to sleep.         Past Medical History:  Diagnosis Date  . Hypothyroidism   . Lumbar vertebral fracture (HCC)   . 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 Type 1 diabetes [facility])     Patient Active Problem List   Diagnosis Date Noted  . Colitis   . Pneumatosis intestinalis   . Indeterminate colitis 08/25/2017  . Hypothyroidism 08/25/2017  . Acute gastrointestinal hemorrhage 08/25/2017  . Hematochezia   . Type 1 diabetes ([place]) 06/30/2017  . Symptomatic bradycardia 06/30/2017  . Syncope 06/28/2017    Past Surgical History:  Procedure Laterality Date  . KYPHOPLASTY N/A 11/18/2017   Procedure: [id number];  Surgeon: Hessie Knows, MD;  Location: [facility] ORS;  Service: Orthopedics;  Laterality: N/A;  L4  . NO PAST SURGERIES      Prior to Admission medications   Medication Sig Start Date End Date Taking? Authorizing Provider  acetaminophen (TYLENOL) 325 MG tablet Take 2 tablets (650 mg total) by mouth every 6 (six) hours as needed for mild pain (or Fever >/= 101). 08/29/17   [name], MD  calcium carbonate (OSCAL) 1500 (600 Ca) MG TABS tablet Take 600 mg by mouth daily with breakfast.    [provider]  Cyanocobalamin 1000 MCG TBCR Take 1,000 mcg by mouth daily.    [provider]    divalproex (DEPAKOTE) 500 MG DR tablet Take 1 tablet (500 mg total) by mouth 2 (two) times daily. 08/29/17   [name], MD  HYDROcodone-acetaminophen (NORCO) 5-325 MG tablet Take 1 tablet by mouth every 6 (six) hours as needed for moderate pain. 11/18/17   Hessie Knows, MD  insulin aspart (NOVOLOG FLEXPEN) 100 UNIT/ML FlexPen Inject 13 Units into the skin 3 (three) times daily with meals. If blood sugar is less than 60, call MD If blood sugar is 200-250, give 14 unit If blood sugar is 251-300, give 15 unit If blood sugar is 301-350, give 16 unit If blood sugar is 351-400, give 17 unit If blood sugar is 401-450, give 18 unit If blood sugar is greater than 450, call MD    [provider]  insulin detemir (LEVEMIR) 100 UNIT/ML injection Inject 0.1 mLs (10 Units total) into the skin 2 (two) times daily. Patient taking differently: Inject 22 Units into the skin 2 (two) times daily.  08/29/17   Gouru, Illene Silver, MD  insulin lispro (HUMALOG) 100 UNIT/ML injection Inject 0.02 mLs (2 Units total) into the skin 3 (three) times daily before meals. Patient not taking: Reported on 11/14/2017 08/29/17   [name], MD  levothyroxine (SYNTHROID, LEVOTHROID) 125 MCG tablet TAKE ONE TABLET BY MOUTH EVERY MORNING BEFORE BREAKFAST] 06/24/17   [provider]  Multiple Vitamin (MULTIVITAMIN WITH MINERALS) TABS tablet Take 1 tablet by  mouth daily.    [provider]  oxyCODONE (OXY IR/ROXICODONE) 5 MG immediate release tablet Take 1 tablet (5 mg total) by mouth every 6 (six) hours as needed for severe pain. 08/29/17   Gouru, Illene Silver, MD  pantoprazole (PROTONIX) 40 MG tablet Take 1 tablet (40 mg total) by mouth daily. 08/30/17   [name], MD  Vitamin D, Ergocalciferol, (DRISDOL) 50000 units CAPS capsule Take 50,000 Units by mouth every Friday.     [provider]    Allergies Patient has no known allergies.  Family History  Problem Relation Age of Onset  . Hypertension  Mother     Social History Social History   Tobacco Use  . Smoking status: Never Smoker  . Smokeless tobacco: Never Used  Vaping Use  . Vaping Use: Never used  Substance Use Topics  . Alcohol use: No  . Drug use: No    Review of Systems Unable to obtain due to decreased responsiveness  ____________________________________________   PHYSICAL EXAM:  VITAL SIGNS: ED Triage Vitals [07/21/20 1431]  Enc Vitals Group     BP      Pulse      Resp      Temp      Temp src      SpO2 96 %     Weight      Height      Head Circumference      Peak Flow      Pain Score      Pain Loc      Pain Edu?      Excl. in [place]?     Constitutional: Sleepy but arousable tells me nothing hurts him Eyes: Conjunctivae are normal. PERRL. EOMI. Head: Atraumatic. Nose: No congestion/rhinnorhea. Mouth/Throat: Mucous membranes are moist.  Oropharynx non-erythematous. Neck: No stridor.  Not stiff  cardiovascular: Normal rate, regular rhythm. Grossly normal heart sounds.  Good peripheral circulation. Respiratory: Normal respiratory effort.  No retractions. Lungs CTAB. Gastrointestinal: Soft and nontender. No distention. No abdominal bruits. Musculoskeletal: No lower extremity tenderness nor edema.  Patient complains of pain across his low back Neurologic: Sleepy but arousable not moving his extremities toes go neither up nor down Skin:  Skin is warm, dry and intact. No rash noted.   ____________________________________________   LABS (all labs ordered are listed, but only abnormal results are displayed)  Labs Reviewed  COMPREHENSIVE METABOLIC PANEL - Abnormal; Notable for the following components:      Result Value   Sodium 133 (*)    Glucose, Bld 214 (*)    Calcium 8.5 (*)    Total Protein 6.3 (*)    All other components within normal limits  CBC WITH DIFFERENTIAL/PLATELET - Abnormal; Notable for the following components:   WBC 11.4 (*)    Neutro Abs 9.1 (*)    All other components  within normal limits  BLOOD GAS, VENOUS - Abnormal; Notable for the following components:   pCO2, Ven 42 (*)    pO2, Ven 61.0 (*)    All other components within normal limits  ETHANOL  LACTIC ACID, PLASMA  ACETAMINOPHEN LEVEL  SALICYLATE LEVEL  LACTIC ACID, PLASMA  URINALYSIS, COMPLETE (UACMP) WITH MICROSCOPIC  URINE DRUG SCREEN, QUALITATIVE ([facility] ONLY)  SEDIMENTATION RATE  SAMPLE TO BLOOD BANK  TROPONIN I (HIGH SENSITIVITY)   ____________________________________________  EKG  EKG read interpreted by me shows normal sinus rhythm rate of 83 normal axis no acute ST-T wave changes ____________________________________________  RADIOLOGY  ED MD interpretation:  Official radiology report(s): DG Chest Portable 1 View  Result Date: 07/21/2020 CLINICAL DATA:  Altered mental status. EXAM: PORTABLE CHEST 1 VIEW COMPARISON:  June 28, 2017 FINDINGS: The cardiomediastinal silhouette is unchanged in contour. No pleural effusion. No pneumothorax. No acute pleuroparenchymal abnormality. Visualized abdomen is unremarkable. No acute osseous abnormality. IMPRESSION: No acute cardiopulmonary abnormality. Electronically Signed   By: Valentino Saxon MD   On: 07/21/2020 14:59    ____________________________________________   PROCEDURES  Procedure(s) performed (including Critical Care):  Procedures   ____________________________________________   INITIAL IMPRESSION / ASSESSMENT AND PLAN / ED COURSE  Patient studies are pending.  I will sign this patient out.  I have reviewed his history with the oncoming physician.              ____________________________________________   FINAL CLINICAL IMPRESSION(S) / ED DIAGNOSES  Final diagnoses:  Altered mental status, unspecified altered mental status type     ED Discharge Orders    None      *Please note:  Carlos Mueller was evaluated in Emergency Department on 07/21/2020 for the symptoms described in the history of  present illness. He was evaluated in the context of the global COVID-19 pandemic, which necessitated consideration that the patient might be at risk for infection with the SARS-CoV-2 virus that causes COVID-19. Institutional protocols and algorithms that pertain to the evaluation of patients at risk for COVID-19 are in a state of rapid change based on information released by regulatory bodies including the CDC and federal and state organizations. These policies and algorithms were followed during the patient's care in the ED.  Some ED evaluations and interventions may be delayed as a result of limited staffing during and the pandemic.*   Note:  This document was prepared using Dragon voice recognition software and may include unintentional dictation errors.    Nena Polio, MD 07/21/20 [phone]

## 2020-07-21 NOTE — ED Notes (Signed)
Pt on phone with MRI answering screening questions. 

## 2020-07-21 NOTE — ED Notes (Signed)
Yellow fall prevention socks placed on pt. Pt assisted to sitting position on side of bed. Pt able to ambulate around room with no assistance. Pt states he feels a little weak but denies dizziness upon standing.

## 2020-07-21 NOTE — Discharge Instructions (Signed)
You were seen in the ED because of your confusion at home in the setting of low blood sugar.  We did extensive blood work, heart testing, imaging of your brain and back that did not show any signs of systemic illness.   Your MRI of your lower back was about the same as it was in 2018 and 2019 without any evidence of pinching of your spinal cord.  Because of your inability to fully pee, we placed a Foley catheter into your bladder to help drain your urine.  You are also being discharged with a prescription for Flomax to help relax your prostate, please take this once daily moving forward.  Please call the office of Dr. Jeffie Pollock to schedule an appointment in the next 1-2 weeks to be seen.  This is the urologist to help you with your urinary retention.  Please continue all of your normal home medications, including her insulin.  Please follow-up with your PCP within the next 1 week to discuss this visit.  If you develop any further episodes of confusion or poorly controlled blood sugars, please return to the ED immediately.

## 2020-07-21 NOTE — ED Triage Notes (Signed)
Pt to ED via [facility] from home. Per EMS pt had called his boss to come get him because he wasn't feeling well. Pt fell out and became unresponsive. VSS with EMS. EMS stating PERRLA.   Upon arrival pt remains unresponsive. Pt with minimal response to sternal rub. Pt's eyes deviating from left to right. Pt with hx DM and recent back surgery.

## 2020-07-21 NOTE — ED Provider Notes (Signed)
59 year old male presents from home due to altered mental status, most attributable to symptomatic hypoglycemia, found to have acute urinary retention in the ED likely due to BPH and ultimately amenable to outpatient management.  Please see the below ED course for more detail.  His presenting altered mentation self resolved and patient had no further episodes of hypoglycemia.  He recalls noting blood glucose levels in the 50s at home, while he was symptomatic with generalized weakness, dizziness, and this was likely the source of his presenting symptoms.  Upon awakening and returning to his cogent and normal mental status, patient denies any intentional overdose of insulin or desire for self-harm.  He was noted to have benign blood work, head imaging, EKG, CXR and really no acute pathology on his additional work-up.  With his clearing and resolving mental status I was reassured that his presenting symptoms are likely due to hypoglycemia.  There is complaints of chronic low back pain and CT findings, postvoid residual was obtained and demonstrates evidence of acute urinary retention.  This obviously raised the concern for acute spinal cord compression and so MRI was obtained and neurosurgery was consulted.  Thankfully, no evidence of acute cord compression or any changes when compared to MRIs from 2 years ago.  Ultimately, I believe patient was symptomatic from hypoglycemia this morning due to his fluctuating blood glucose levels in the setting of his insulin usage.  Further with acute urinary retention due to BPH, which was resolved with indwelling Foley catheter administration.  He was observed in the ED for greater than 6 hours with intact mental status without any further episodes of altered mental status or hypoglycemia.  He is ambulating at baseline, tolerating p.o. intake and asymptomatic for multiple hours.  I offered medical observation admission, but he and his and girlfriend decline.  We thoroughly  discussed outpatient management of acute urinary retention, his type 1 diabetes.  We discussed return precautions for the ED.  Patient medically stable discharge home.  Clinical Course as of Jul 21 2346  Fri Jul 21, 2020  1523 Patient received in signout from Dr. Cinda Quest for evaluation of altered mentation and acute on chronic low back pain.  Patient evaluated at the bedside. Normal vitals, no distress. He is complaining of generalized not feeling well and acute on chronic low back pain in an area of previous fracture and surgery (orthopedic vertebral augmentation). Freely moving head/neck around without meningismus    [DS]  1532 RN spoke with sister and BIL, apparently pt is diligent in documenting his blood glucose at home. Last logged was 50. RN didn't know what time. Family is on the way to the hospital   [DS]  1623 Reassessed.  Patient back from CT and more awake.  He looks over as I enter the room and greets me appropriately.  He reports he is "coming around a little bit more now."  He does provide a urine sample.  CT reviewed with moderate canal stenosis.  Asked RN to acquire postvoid residual since he just provided urine sample.   [DS]  5053 [id number].  Patient continues to look more alert and well.  He recalls coming home from work this afternoon at lunchtime to eat his lunch, checking his blood glucose before eating and noting a value within the 50s.  He reports feeling weak, dizzy and unwell at this time.  He reports drinking juice and eating lunch, then "I do not know what happened."  He reports eating breakfast as normal.  Denies  taking too much insulin by accident or on purpose.   [DS]  0569 VX informs me of postvoid residual of greater than 500.  I return to the bedside and educate the patient that I am concerned about spinal cord compression or cauda equina syndrome.  Educated patient that we will provide him steroids and perform MRI to better assess the spinal canal with the likely  plan to speak with neurosurgery.  Answered questions.  He expresses understanding and agreement with Foley catheter placement and the above.   [DS]  4801 Girlfriend at the bedside.  Educated work-up with evidence of symptomatic hypoglycemia and spinal cord compression.  Answered questions.  We discussed Foley catheter, steroids, MRI and likely neurosurgical evaluation.   [DS]  1945 Discussed case with NSGY on call, Dr. Izora Ribas, who reviews the patient's chart and CT imaging.  He has no further recommendations beyond what is being performed with steroids and MRI.  He requests a call back after MRI is read.   [DS]  2133 MRI reviewed without acute cord compression.  Dr. Izora Ribas also reviewed this MRI and agrees.  No acute neurosurgical needs   [DS]  2152 Reassessed.  Patient continues to look well.  He has had no further episodes of hypoglycemia or altered mental status.  We discussed possibility of outpatient management versus observation admission for symptomatic hypoglycemia and acute urinary retention.  Patient and girlfriend are extremely eager to go home and motivated to be managed as an outpatient.  We discussed his benign blood work, urine testing and imaging here in the ED.  We discussed lack of clinical worsening and his observation for 6 hours with improving mental status and no worsening.  I think it is reasonable to go home with close follow-up with PCP, endocrinology and urology.  We discussed Flomax initiation and home Foley care with leg bag.   [DS]    Clinical Course User Index [DS] Vladimir Crofts, MD      Vladimir Crofts, MD 07/21/20 [phone]

## 2020-07-21 NOTE — ED Notes (Signed)
Patient transported to MRI 

## 2020-07-21 NOTE — ED Notes (Signed)
Leg bag placed on pt. Pt informed on how to drain bag upon discharge. Pt verbalized understanding and demonstrated using teachback method.

## 2020-07-31 ENCOUNTER — Ambulatory Visit: Payer: 59 | Admitting: Physician Assistant

## 2020-07-31 ENCOUNTER — Other Ambulatory Visit: Payer: Self-pay

## 2020-07-31 ENCOUNTER — Encounter: Payer: Self-pay | Admitting: Urology

## 2020-07-31 ENCOUNTER — Ambulatory Visit: Payer: No Typology Code available for payment source | Admitting: Urology

## 2020-07-31 VITALS — BP 154/73 | HR 103 | Ht 68.5 in | Wt 149.0 lb

## 2020-07-31 DIAGNOSIS — R339 Retention of urine, unspecified: Secondary | ICD-10-CM | POA: Diagnosis not present

## 2020-07-31 DIAGNOSIS — Z125 Encounter for screening for malignant neoplasm of prostate: Secondary | ICD-10-CM | POA: Diagnosis not present

## 2020-07-31 DIAGNOSIS — N401 Enlarged prostate with lower urinary tract symptoms: Secondary | ICD-10-CM | POA: Diagnosis not present

## 2020-07-31 DIAGNOSIS — Z466 Encounter for fitting and adjustment of urinary device: Secondary | ICD-10-CM

## 2020-07-31 DIAGNOSIS — N138 Other obstructive and reflux uropathy: Secondary | ICD-10-CM

## 2020-07-31 MED ORDER — SULFAMETHOXAZOLE-TRIMETHOPRIM 800-160 MG PO TABS
2.0000 | ORAL_TABLET | Freq: Once | ORAL | Status: AC
  Administered 2020-07-31: 2 via ORAL

## 2020-07-31 MED ORDER — SULFAMETHOXAZOLE-TRIMETHOPRIM 800-160 MG PO TABS
2.0000 | ORAL_TABLET | Freq: Two times a day (BID) | ORAL | Status: DC
  Administered 2020-07-31: 2 via ORAL

## 2020-07-31 NOTE — Progress Notes (Signed)
07/31/20 9:17 AM   Carlos Mueller [birth year] [id number]  CC: Urinary retention  HPI: I saw [name] in urology clinic today for urinary retention and voiding trial.  He is a 59 year old male with insulin-dependent diabetes(hemoglobin A1c 7.4 in July 2021) who recently presented to the ED on 07/21/2020 with altered mental status and hypoglycemia.  At that time he was also having some lower abdominal pain and bladder scan was elevated at 550 mL and a catheter was placed.  He also had a CT performed at that time that showed a distended bladder but no hydronephrosis or other abnormalities, and prostate measured 30 g on CT.  He also had an MRI spine at that visit which showed no significant changes from prior imaging in 2018.  He denies any prior urinary symptoms or episodes of retention or UTI.  Urinalysis at that visit was completely benign.  He denies any history of gross hematuria.  He has a family history of lethal prostate cancer.  I do not see that he has ever had PSAs checked previously.   PMH: Past Medical History:  Diagnosis Date  . Hypothyroidism   . Lumbar vertebral fracture (HCC)   . 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 Type 1 diabetes [facility])     Surgical History: Past Surgical History:  Procedure Laterality Date  . KYPHOPLASTY N/A 11/18/2017   Procedure: [id number];  Surgeon: Hessie Knows, MD;  Location: [facility] ORS;  Service: Orthopedics;  Laterality: N/A;  L4  . NO PAST SURGERIES      Family History: Family History  Problem Relation Age of Onset  . Hypertension Mother     Social History:  reports that he has never smoked. He has never used smokeless tobacco. He reports that he does not drink alcohol and does not use drugs.  Physical Exam: BP (!) 154/73 (BP Location: Left Arm, Patient Position: Sitting, Cuff Size: Normal)   Pulse (!) 103   Ht 5' 8.5" (1.74 m)   Wt 149 lb (67.6 kg)   BMI 22.33 kg/m    Constitutional:  Alert and oriented, No acute  distress. Cardiovascular: No clubbing, cyanosis, or edema. Respiratory: Normal respiratory effort, no increased work of breathing. GI: Abdomen is soft, nontender, nondistended, no abdominal masses GU: Foley with clear yellow urine  Laboratory Data: Reviewed, see HPI  Pertinent Imaging: I have personally reviewed the CT showing a distended bladder with 30 g prostate, no other abnormalities or hydronephrosis noted.  His Foley was removed today and 400 mL were instilled, and he was able to void the full 400 mL with a good stream.  Bactrim was given prophylactically this morning, and he was given an additional Bactrim to take this evening.   Assessment & Plan:   In summary, is a 59 year old male with a family history of lethal prostate cancer in his father who recently presented to the ED on 9/17 with altered mental status and hypoglycemia, also found to have urinary retention and a catheter was placed.  He denied any urinary symptoms prior to this hospital visit.  We discussed the likely etiology of his urinary retention was his altered mental status and hypoglycemia.  We also reviewed the need for PSA screening in the future with his family history of lethal prostate cancer.  We also discussed that the PSA can be altered by urinary retention and catheter placement, and that we will wait at least a few months before checking a PSA.  I recommended continuing Flomax for the  next month to see how he is voiding, and consider stopping this medication at follow-up if he is urinating well.  Return precautions were discussed at length.  Continue Flomax RTC 4 to 6 weeks for IPSS and PVR, symptom check Start PSA screening and 2 to 3 months after recovered from acute episode of urinary retention  Nickolas Madrid, MD 07/31/2020  [facility] [address] [place], [place] [address] [phone]

## 2020-07-31 NOTE — Progress Notes (Signed)
Fill and Pull Catheter Removal  Patient is present today for a catheter removal.  Patient was cleaned and prepped in a sterile fashion 437ml saline was instilled into the bladder when the patient felt the urge to urinate. 49ml of water was then drained from the balloon. A 16FR foley cath was removed from the bladder no complications were noted. Patient as then given some time to void on their own.  Patient was able to void  459ml on their own after some time.  Patient tolerated well. Patient given Bactrim DS prophylactically  per verbal orders.   Performed ML:[id number] Julieanna Geraci, CMA   Follow up/ Additional notes: RTC in 6wks per [facility]

## 2020-07-31 NOTE — Patient Instructions (Signed)

## 2020-09-27 ENCOUNTER — Ambulatory Visit (INDEPENDENT_AMBULATORY_CARE_PROVIDER_SITE_OTHER): Payer: No Typology Code available for payment source | Admitting: Urology

## 2020-09-27 ENCOUNTER — Other Ambulatory Visit: Payer: Self-pay

## 2020-09-27 ENCOUNTER — Encounter: Payer: Self-pay | Admitting: Urology

## 2020-09-27 VITALS — BP 134/75 | HR 87 | Ht 68.0 in | Wt 150.0 lb

## 2020-09-27 DIAGNOSIS — N401 Enlarged prostate with lower urinary tract symptoms: Secondary | ICD-10-CM

## 2020-09-27 DIAGNOSIS — Z125 Encounter for screening for malignant neoplasm of prostate: Secondary | ICD-10-CM

## 2020-09-27 DIAGNOSIS — N138 Other obstructive and reflux uropathy: Secondary | ICD-10-CM | POA: Diagnosis not present

## 2020-09-27 LAB — BLADDER SCAN AMB NON-IMAGING

## 2020-09-27 NOTE — Patient Instructions (Signed)
Prostate Cancer Screening  Prostate cancer screening is a test that is done to check for the presence of prostate cancer in men. The prostate gland is a walnut-sized gland that is located below the bladder and in front of the rectum in males. The function of the prostate is to add fluid to semen during ejaculation. Prostate cancer is the second most common type of cancer in men. Who should have prostate cancer screening?  Screening recommendations vary based on age and other risk factors. Screening is recommended if:  You are older than age 55. If you are age 59-69, talk with your health care provider about your need for screening and how often screening should be done. Because most prostate cancers are slow growing and will not cause death, screening is generally reserved in this age group for men who have a 10-15-year life expectancy.  You are younger than age 55, and you have these risk factors: ? Being a black male or a male of African descent. ? Having a father, brother, or uncle who has been diagnosed with prostate cancer. The risk is higher if your family member's cancer occurred at an early age. Screening is not recommended if:  You are younger than age 40.  You are between the ages of 40 and 54 and you have no risk factors.  You are 59 years of age or older. At this age, the risks that screening can cause are greater than the benefits that it may provide. If you are at high risk for prostate cancer, your health care provider may recommend that you have screenings more often or that you start screening at a younger age. How is screening for prostate cancer done? The recommended prostate cancer screening test is a blood test called the prostate-specific antigen (PSA) test. PSA is a protein that is made in the prostate. As you age, your prostate naturally produces more PSA. Abnormally high PSA levels may be caused by:  Prostate cancer.  An enlarged prostate that is not caused by cancer  (benign prostatic hyperplasia, BPH). This condition is very common in older men.  A prostate gland infection (prostatitis). Depending on the PSA results, you may need more tests, such as:  A physical exam to check the size of your prostate gland.  Blood and imaging tests.  A procedure to remove tissue samples from your prostate gland for testing (biopsy). What are the benefits of prostate cancer screening?  Screening can help to identify cancer at an early stage, before symptoms start and when the cancer can be treated more easily.  There is a small chance that screening may lower your risk of dying from prostate cancer. The chance is small because prostate cancer is a slow-growing cancer, and most men with prostate cancer die from a different cause. What are the risks of prostate cancer screening? The main risk of prostate cancer screening is diagnosing and treating prostate cancer that would never have caused any symptoms or problems. This is called overdiagnosisand overtreatment. PSA screening cannot tell you if your PSA is high due to cancer or a different cause. A prostate biopsy is the only procedure to diagnose prostate cancer. Even the results of a biopsy may not tell you if your cancer needs to be treated. Slow-growing prostate cancer may not need any treatment other than monitoring, so diagnosing and treating it may cause unnecessary stress or other side effects. A prostate biopsy may also cause:  Infection or fever.  A false negative. This is   a result that shows that you do not have prostate cancer when you actually do have prostate cancer. Questions to ask your health care provider  When should I start prostate cancer screening?  What is my risk for prostate cancer?  How often do I need screening?  What type of screening tests do I need?  How do I get my test results?  What do my results mean?  Do I need treatment? Where to find more information  The American Cancer  Society: www.cancer.org  American Urological Association: www.auanet.org Contact a health care provider if:  You have difficulty urinating.  You have pain when you urinate or ejaculate.  You have blood in your urine or semen.  You have pain in your back or in the area of your prostate. Summary  Prostate cancer is a common type of cancer in men. The prostate gland is located below the bladder and in front of the rectum. This gland adds fluid to semen during ejaculation.  Prostate cancer screening may identify cancer at an early stage, when the cancer can be treated more easily.  The prostate-specific antigen (PSA) test is the recommended screening test for prostate cancer.  Discuss the risks and benefits of prostate cancer screening with your health care provider. If you are age 59 or older, the risks that screening can cause are greater than the benefits that it may provide. This information is not intended to replace advice given to you by your health care provider. Make sure you discuss any questions you have with your health care provider. Document Revised: 06/03/2019 Document Reviewed: 06/03/2019 Elsevier Patient Education  2020 Elsevier Inc.  

## 2020-09-27 NOTE — Progress Notes (Signed)
   09/27/2020 4:55 PM   Carlos Mueller 11/04/1961 [id number]  Reason for visit: Follow up urinary retention, PSA screening  HPI: I saw [name] back in urology clinic for follow-up of urinary retention and for PSA screening.  He is a 59 year old male with insulin-dependent diabetes who was admitted in September 2021 with altered mental status and hypoglycemia as well as urinary retention, and a catheter was placed at that time.  He was sent to [place] on 07/31/2020 for follow-up catheter removal.  He does have a family history of lethal prostate cancer in his father.  Prostate measured 30 g on prior CT-I personally viewed and interpreted the CT that shows a 30 g prostate with a mildly distended bladder and no hydronephrosis.  His catheter was removed at that visit and he was given Bactrim prophylactically prior to catheter removal, as well as a second dose to take in the evening.  Unfortunately he still developed an E. coli UTI on 08/09/2020 with dysuria and malaise, and was treated with culture appropriate antibiotics with relief of his urinary symptoms.  He has been voiding well with a strong stream on Flomax since that time and denies any urinary complaints today.  PVR is normal at 87 mL.  IPSS score today is 4.  He finished his 1 month course of Flomax.  DRE today shows a 30 g smooth prostate with no masses or nodules.  We discussed possible need to resume Flomax in the future if he has worsening urinary symptoms.  Return precautions were discussed at length.  I also recommended PSA screening with his family history of lethal prostate cancer, and PSA was drawn today.  Will call with those results.  We reviewed the risks and benefits of PSA screening at length.  Okay to discontinue Flomax, resume in the future if recurrent urinary symptoms Call with PSA results   Billey Co, [place] [address] [place], [place] [address] [phone]

## 2020-09-28 LAB — PSA TOTAL (REFLEX TO FREE): Prostate Specific Ag, Serum: 1 ng/mL (ref 0.0–4.0)

## 2020-10-02 ENCOUNTER — Telehealth: Payer: Self-pay

## 2020-10-02 DIAGNOSIS — Z125 Encounter for screening for malignant neoplasm of prostate: Secondary | ICD-10-CM

## 2020-10-02 NOTE — Telephone Encounter (Signed)
Called pt informed him of the information below. Pt gave verbal understanding. 4yr appointment scheduled. Lab ordered.

## 2020-10-02 NOTE — Telephone Encounter (Signed)
-----   Message from Billey Co, MD sent at 09/29/2020  1:21 PM EST ----- Good news, PSA is normal.  Please schedule follow-up in 1 year with IPSS and PVR, and PSA prior  Thanks Nickolas Madrid, MD 09/29/2020

## 2020-12-27 IMAGING — CT CT HEAD W/O CM
3 series · 15 of 47 positions shown, 18 images · non-contrast
Comparison: Head CT 06/30/2017.

CLINICAL DATA: 59-year-old male with acute on chronic low back
pain. Syncope, fall, decreased mental status.

EXAM:
CT HEAD WITHOUT CONTRAST
TECHNIQUE: Contiguous axial images were obtained from the base of the skull
through the vertex without intravenous contrast.

[Series 3: head wo · axial · 0.46mm/px · z∈[+245,+370]mm · 9 of 31 slices shown, 12 images]
[im 3/31  brain]
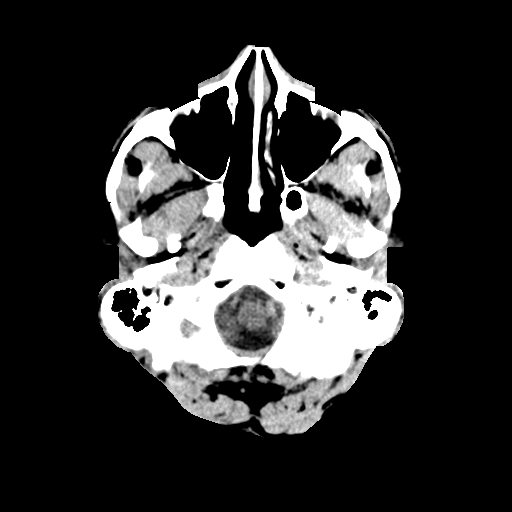
[im 3/31  bone]
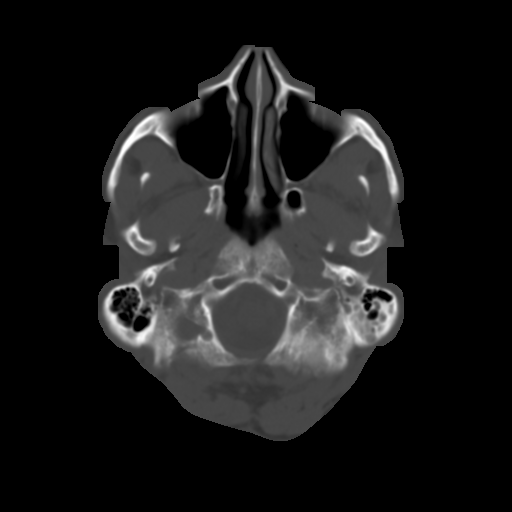
[im 6/31  brain]
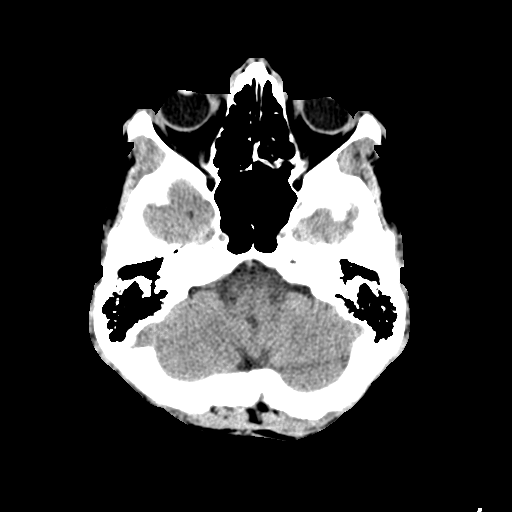
[im 9/31  brain]
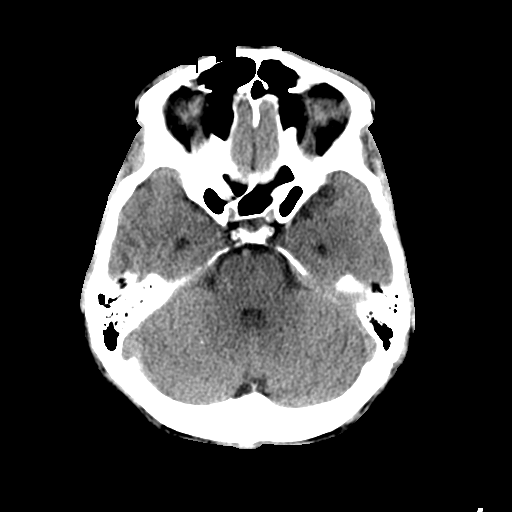
[im 12/31  brain]
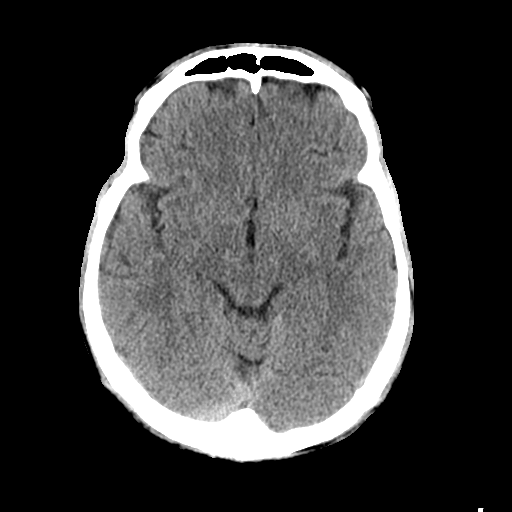
[im 16/31  brain]
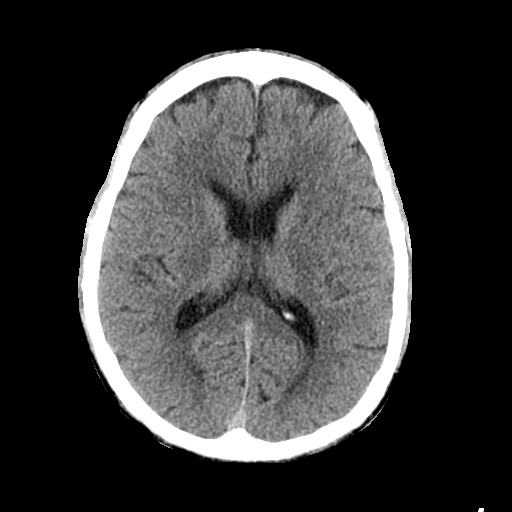
[im 16/31  bone]
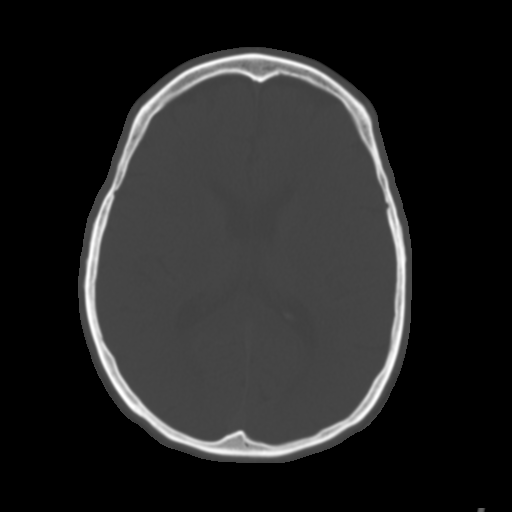
[im 19/31  brain]
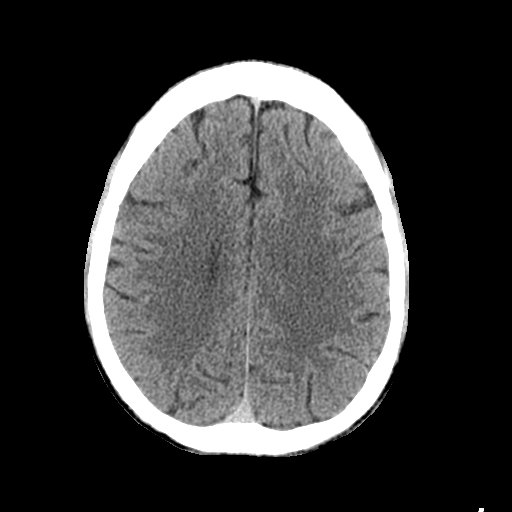
[im 22/31  brain]
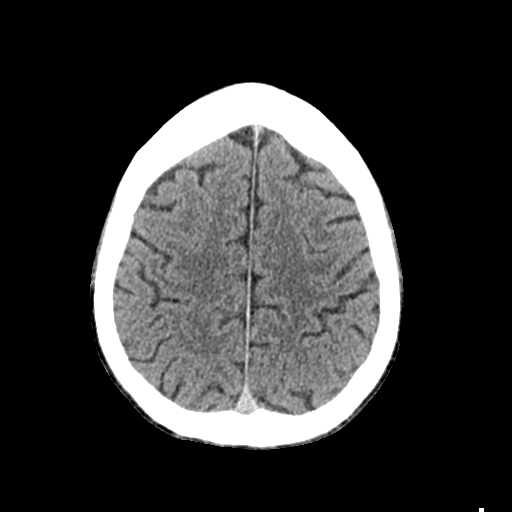
[im 25/31  brain]
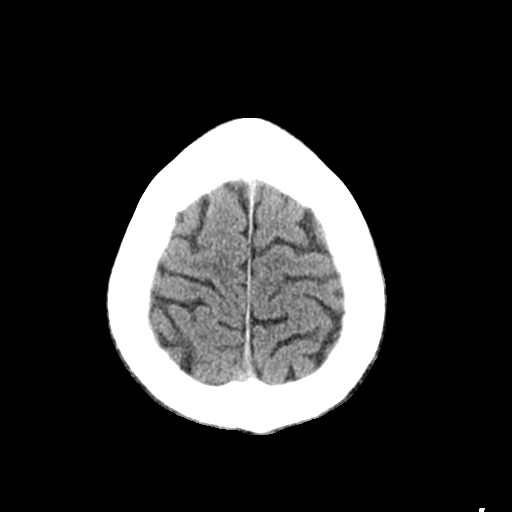
[im 28/31  brain]
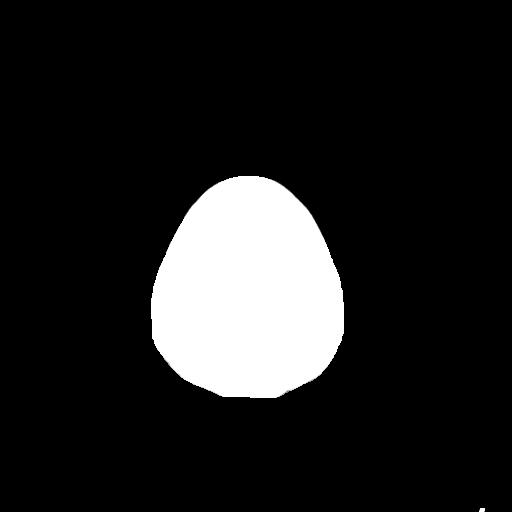
[im 28/31  bone]
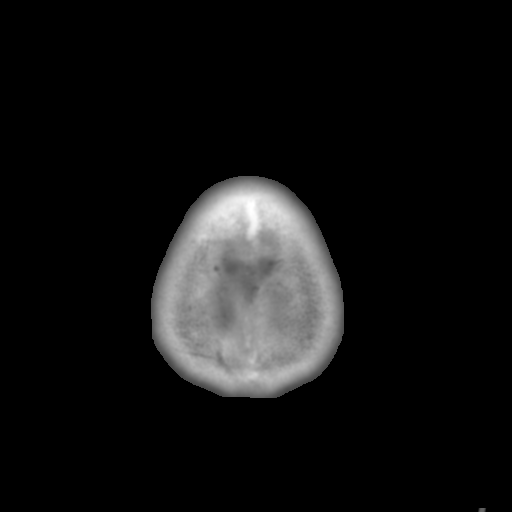

[Series 4: coronal soft tissue · coronal · 0.34mm/px · 3 of 68 slices shown]
[im 23/68  brain]
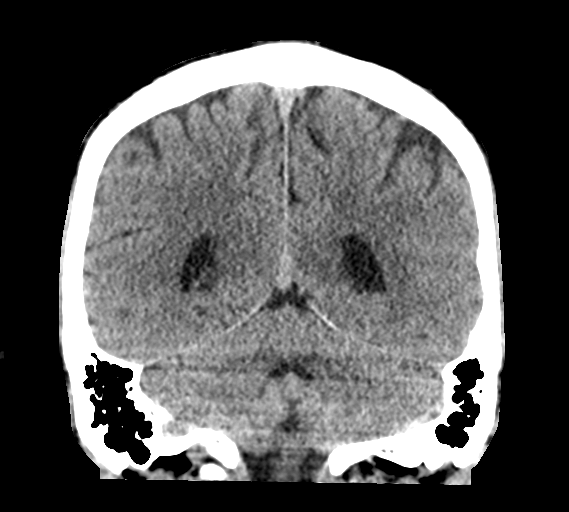
[im 30/68  brain]
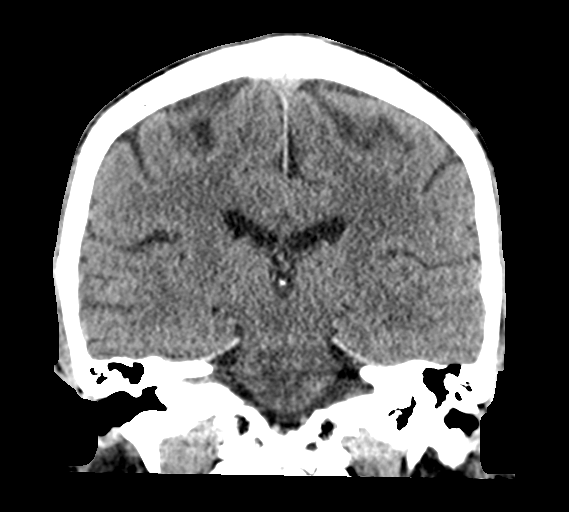
[im 38/68  brain]
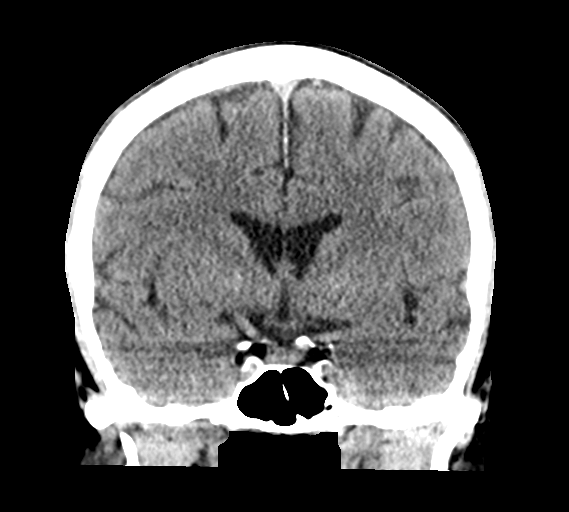

[Series 5: sagittal soft tissue · sagittal · 0.35mm/px · 3 of 54 slices shown]
[im 18/54  brain]
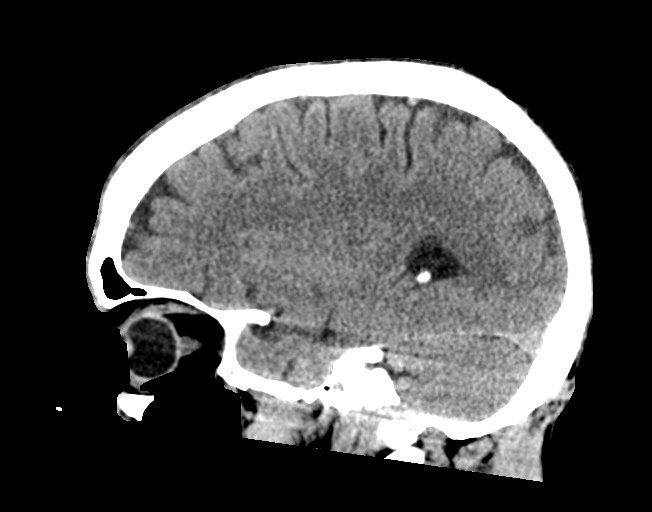
[im 27/54  brain]
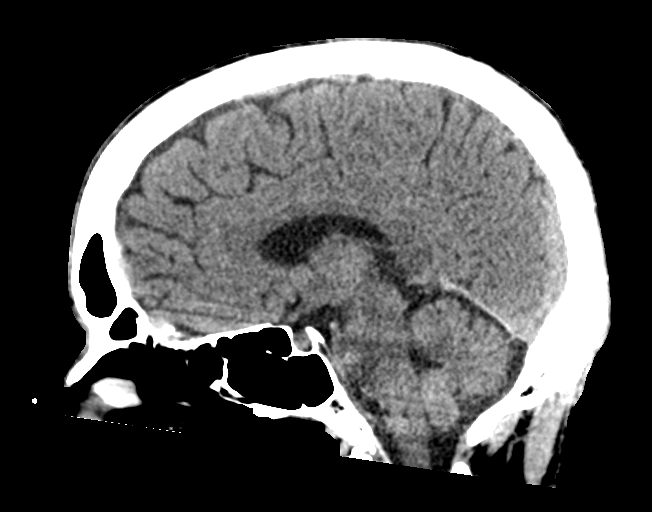
[im 36/54  brain]
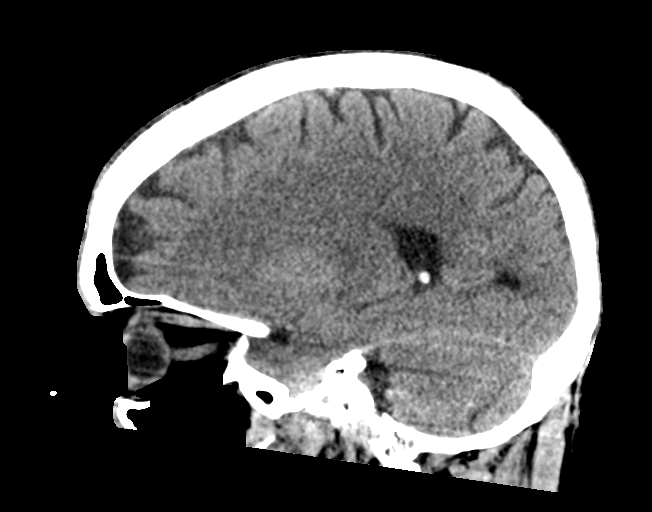

[15 of 47 positions shown; findings below may reference images not displayed]

FINDINGS: Brain: Cerebral volume is not significantly changed. No midline
shift, ventriculomegaly, mass effect, evidence of mass lesion,
intracranial hemorrhage or evidence of cortically based acute
infarction. Gray-white matter differentiation is within normal
limits throughout the brain. No encephalomalacia identified.

Vascular: Mild Calcified atherosclerosis at the skull base. No
suspicious intracranial vascular hyperdensity.

Skull: Negative.

Sinuses/Orbits: Visualized paranasal sinuses and mastoids are clear.

Other: No acute orbit or scalp soft tissue finding.
IMPRESSION: Stable and normal noncontrast CT appearance of the brain.

## 2021-06-12 ENCOUNTER — Encounter: Payer: Self-pay | Admitting: Internal Medicine

## 2021-06-13 ENCOUNTER — Ambulatory Visit
Admission: RE | Admit: 2021-06-13 | Discharge: 2021-06-13 | Disposition: A | Discharge status: Home / Self Care | Payer: BC Managed Care – PPO | Attending: Internal Medicine | Admitting: Internal Medicine

## 2021-06-13 ENCOUNTER — Encounter
Admission: RE | Disposition: A | Discharge status: Home / Self Care | Payer: Self-pay | Source: Home / Self Care | Attending: Internal Medicine

## 2021-06-13 ENCOUNTER — Ambulatory Visit: Payer: BC Managed Care – PPO | Admitting: Certified Registered Nurse Anesthetist

## 2021-06-13 DIAGNOSIS — Z8661 Personal history of infections of the central nervous system: Secondary | ICD-10-CM | POA: Diagnosis not present

## 2021-06-13 DIAGNOSIS — Z794 Long term (current) use of insulin: Secondary | ICD-10-CM | POA: Diagnosis not present

## 2021-06-13 DIAGNOSIS — E109 Type 1 diabetes mellitus without complications: Secondary | ICD-10-CM | POA: Diagnosis not present

## 2021-06-13 DIAGNOSIS — K640 First degree hemorrhoids: Secondary | ICD-10-CM | POA: Diagnosis not present

## 2021-06-13 DIAGNOSIS — Z79890 Hormone replacement therapy (postmenopausal): Secondary | ICD-10-CM | POA: Diagnosis not present

## 2021-06-13 DIAGNOSIS — Z7983 Long term (current) use of bisphosphonates: Secondary | ICD-10-CM | POA: Diagnosis not present

## 2021-06-13 DIAGNOSIS — D123 Benign neoplasm of transverse colon: Secondary | ICD-10-CM | POA: Insufficient documentation

## 2021-06-13 DIAGNOSIS — Z79899 Other long term (current) drug therapy: Secondary | ICD-10-CM | POA: Diagnosis not present

## 2021-06-13 DIAGNOSIS — K5730 Diverticulosis of large intestine without perforation or abscess without bleeding: Secondary | ICD-10-CM | POA: Diagnosis not present

## 2021-06-13 DIAGNOSIS — Z1211 Encounter for screening for malignant neoplasm of colon: Secondary | ICD-10-CM | POA: Insufficient documentation

## 2021-06-13 DIAGNOSIS — R569 Unspecified convulsions: Secondary | ICD-10-CM | POA: Insufficient documentation

## 2021-06-13 DIAGNOSIS — E039 Hypothyroidism, unspecified: Secondary | ICD-10-CM | POA: Insufficient documentation

## 2021-06-13 HISTORY — DX: Vitamin D deficiency, unspecified: E55.9

## 2021-06-13 HISTORY — PX: COLONOSCOPY WITH PROPOFOL: SHX5780

## 2021-06-13 HISTORY — DX: Unspecified convulsions: R56.9

## 2021-06-13 LAB — GLUCOSE, CAPILLARY: Glucose-Capillary: 192 mg/dL — ABNORMAL HIGH (ref 70–99)

## 2021-06-13 SURGERY — COLONOSCOPY WITH PROPOFOL
Anesthesia: General

## 2021-06-13 MED ORDER — PROPOFOL 10 MG/ML IV BOLUS
INTRAVENOUS | Status: DC | PRN
  Administered 2021-06-13: 20 mg via INTRAVENOUS
  Administered 2021-06-13: 50 mg via INTRAVENOUS

## 2021-06-13 MED ORDER — SODIUM CHLORIDE (PF) 0.9 % IJ SOLN
INTRAMUSCULAR | Status: DC | PRN
  Administered 2021-06-13: 2 mL via INTRAVENOUS

## 2021-06-13 MED ORDER — LIDOCAINE HCL (CARDIAC) PF 100 MG/5ML IV SOSY
PREFILLED_SYRINGE | INTRAVENOUS | Status: DC | PRN
  Administered 2021-06-13: 50 mg via INTRAVENOUS

## 2021-06-13 MED ORDER — PROPOFOL 500 MG/50ML IV EMUL
INTRAVENOUS | Status: DC | PRN
  Administered 2021-06-13: 100 ug/kg/min via INTRAVENOUS

## 2021-06-13 MED ORDER — SODIUM CHLORIDE 0.9 % IV SOLN
INTRAVENOUS | Status: DC
  Administered 2021-06-13: 20 mL/h via INTRAVENOUS

## 2021-06-13 NOTE — Transfer of Care (Signed)
Immediate Anesthesia Transfer of Care Note  Patient: Carlos Mueller  Procedure(s) Performed: COLONOSCOPY WITH PROPOFOL  Patient Location: PACU  Anesthesia Type:General  Level of Consciousness: drowsy  Airway & Oxygen Therapy: Patient Spontanous Breathing  Post-op Assessment: Report given to RN and Post -op Vital signs reviewed and stable  Post vital signs: Reviewed and stable  Last Vitals:  Vitals Value Taken Time  BP 106/48 06/13/21 1015  Temp    Pulse 64 06/13/21 1015  Resp 12 06/13/21 1015  SpO2 100 % 06/13/21 1015    Last Pain:  Vitals:   06/13/21 1014  TempSrc:   PainSc: Asleep         Complications: No notable events documented.

## 2021-06-13 NOTE — Interval H&P Note (Signed)
History and Physical Interval Note:  06/13/2021 9:41 AM  Carlos Mueller  has presented today for surgery, with the diagnosis of COLON CANCER SCREENING.  The various methods of treatment have been discussed with the patient and family. After consideration of risks, benefits and other options for treatment, the patient has consented to  Procedure(s): COLONOSCOPY WITH PROPOFOL (N/A) as a surgical intervention.  The patient's history has been reviewed, patient examined, no change in status, stable for surgery.  I have reviewed the patient's chart and labs.  Questions were answered to the patient's satisfaction.     [place], [place]

## 2021-06-13 NOTE — Anesthesia Postprocedure Evaluation (Signed)
Anesthesia Post Note  Patient: Carlos Mueller  Procedure(s) Performed: COLONOSCOPY WITH PROPOFOL  Patient location during evaluation: Endoscopy Anesthesia Type: General Level of consciousness: awake and alert and oriented Pain management: pain level controlled Vital Signs Assessment: post-procedure vital signs reviewed and stable Respiratory status: spontaneous breathing, nonlabored ventilation and respiratory function stable Cardiovascular status: blood pressure returned to baseline and stable Postop Assessment: no signs of nausea or vomiting Anesthetic complications: no   No notable events documented.   Last Vitals:  Vitals:   06/13/21 1035 06/13/21 1045  BP: 140/64 (!) 146/73  Pulse: 69 67  Resp: 12 16  Temp:    SpO2: 100% 99%    Last Pain:  Vitals:   06/13/21 1035  TempSrc:   PainSc: 0-No pain                 Chaze Hruska

## 2021-06-13 NOTE — Op Note (Signed)
[facility] Patient Name: Carlos Mueller Procedure Date: 06/13/2021 9:38 AM MRN: [id number] Account #: [id number] Date of Birth: 03-Apr-1961 Admit Type: Outpatient Age: 60 Room: [facility] END[facility] ROOM 2 Gender: Male Note Status: Finalized Procedure:             Colonoscopy Indications:           Screening for colorectal malignant neoplasm Providers:             Carlos Pike. Alice Reichert MD, MD Referring MD:          Carlos Hire, MD (Referring MD) Medicines:             Propofol per Anesthesia Complications:         No immediate complications. Procedure:             Pre-Anesthesia Assessment:                        - The risks and benefits of the procedure and the                         sedation options and risks were discussed with the                         patient. All questions were answered and informed                         consent was obtained.                        - Patient identification and proposed procedure were                         verified prior to the procedure by the nurse. The                         procedure was verified in the procedure room.                        - ASA Grade Assessment: III - A patient with severe                         systemic disease.                        - After reviewing the risks and benefits, the patient                         was deemed in satisfactory condition to undergo the                         procedure.                        After obtaining informed consent, the colonoscope was                         passed under direct vision. Throughout the procedure,                         the patient's blood pressure,  pulse, and oxygen                         saturations were monitored continuously. The                         Colonoscope was introduced through the anus and                         advanced to the the cecum, identified by appendiceal                         orifice and ileocecal valve.  The colonoscopy was                         performed without difficulty. The patient tolerated                         the procedure well. The quality of the bowel                         preparation was adequate. The ileocecal valve,                         appendiceal orifice, and rectum were photographed. Findings:      The perianal and digital rectal examinations were normal. Pertinent       negatives include normal sphincter tone and no palpable rectal lesions.      Non-bleeding internal hemorrhoids were found during retroflexion. The       hemorrhoids were Grade I (internal hemorrhoids that do not prolapse).      Multiple small and large-mouthed diverticula were found in the left       colon.      A 4 mm polyp was found in the transverse colon. The polyp was sessile.       The polyp was removed with a jumbo cold forceps. Resection and retrieval       were complete.      A 22 mm polyp was found in the hepatic flexure. The polyp was       carpet-like. Polypectomy was attempted, initially using a saline       injection-lift technique with a hot snare. Polyp resection was       incomplete with this device. This intervention then required a different       device and polypectomy technique. The polyp was removed with a hot       snare. Resection and retrieval were complete. To prevent bleeding after       the polypectomy, two hemostatic clips were successfully placed (MR       conditional). There was no bleeding during, or at the end, of the       procedure.      The exam was otherwise without abnormality. Impression:            - Non-bleeding internal hemorrhoids.                        - Diverticulosis in the left colon.                        - One 4 mm polyp in the transverse colon,  removed with                         a jumbo cold forceps. Resected and retrieved.                        - One 22 mm polyp at the hepatic flexure, removed with                         a hot snare.  Resected and retrieved. Clips (MR                         conditional) were placed.                        - The examination was otherwise normal. Recommendation:        - Patient has a contact number available for                         emergencies. The signs and symptoms of potential                         delayed complications were discussed with the patient.                         Return to normal activities tomorrow. Written                         discharge instructions were provided to the patient.                        - Resume previous diet.                        - Continue present medications.                        - Repeat colonoscopy is recommended for surveillance.                         The colonoscopy date will be determined after                         pathology results from today's exam become available                         for review.                        - Return to GI office PRN.                        - The findings and recommendations were discussed with                         the patient. Procedure Code(s):     --- Professional ---                        [phone], Colonoscopy, flexible; with removal of  tumor(s), polyp(s), or other lesion(s) by snare                         technique                        45380, 59, Colonoscopy, flexible; with biopsy, single                         or multiple                        45381, Colonoscopy, flexible; with directed submucosal                         injection(s), any substance Diagnosis Code(s):     --- Professional ---                        K57.30, Diverticulosis of large intestine without                         perforation or abscess without bleeding                        K63.5, Polyp of colon                        K64.0, First degree hemorrhoids                        Z12.11, Encounter for screening for malignant neoplasm                         of colon CPT copyright 2019  American Medical Association. All rights reserved. The codes documented in this report are preliminary and upon coder review may  be revised to meet current compliance requirements. Carlos Mueller Sella MD, MD 06/13/2021 10:15:38 AM This report has been signed electronically. Number of Addenda: 0 Note Initiated On: 06/13/2021 9:38 AM Scope Withdrawal Time: 0 hours 17 minutes 3 seconds  Total Procedure Duration: 0 hours 21 minutes 39 seconds  Estimated Blood Loss:  Estimated blood loss: none.      [facility]

## 2021-06-13 NOTE — H&P (Signed)
Outpatient short stay form Pre-procedure 06/13/2021 9:40 AM Carlos Mueller K. Alice Reichert, M.D.  Primary Physician: [name], M.D.  Reason for visit:  Colon cancer screening  History of present illness:  60 y/o male naive to colonoscopy presents for colon cancer screening via said procedure. Patient denies change in bowel habits, rectal bleeding, weight loss or abdominal pain.      Current Facility-Administered Medications:    0.9 %  sodium chloride infusion, , Intravenous, Continuous, [place], [name], MD, Last Rate: 20 mL/hr at 06/13/21 0855, 20 mL/hr at 06/13/21 0855  Medications Prior to Admission  Medication Sig Dispense Refill Last Dose   alendronate (FOSAMAX) 70 MG tablet Take 70 mg by mouth once a week.   Past Week   Blood Glucose Monitoring Suppl (FIFTY50 GLUCOSE METER 2.0) w/Device KIT Use as directed BAYER CONTOUR NEXT METER E10.65   06/12/2021   calcium carbonate (OSCAL) 1500 (600 Ca) MG TABS tablet Take 600 mg by mouth daily with breakfast.   Past Week   Continuous Blood Gluc Sensor (FREESTYLE LIBRE 2 SENSOR) MISC USE 1 KIT EVERY 14 DAYS FOR GLUCOSE MONITORING   06/13/2021   Cyanocobalamin 1000 MCG TBCR Take 1,000 mcg by mouth daily.   Past Week   divalproex (DEPAKOTE) 500 MG DR tablet Take 1 tablet (500 mg total) by mouth 2 (two) times daily. 60 tablet 0 Past Week   FEROSUL 325 (65 Fe) MG tablet Take 325 mg by mouth every morning.   Past Week   gabapentin (NEURONTIN) 100 MG capsule Take 100 mg by mouth 2 (two) times daily.   06/13/2021 at [name] 100 UNIT/ML KwikPen SMARTSIG:25 Unit(s) SUB-Q 3 Times Daily   06/13/2021 at 0630   insulin glargine, 2 Unit Dial, (TOUJEO MAX SOLOSTAR) 300 UNIT/ML Solostar Pen Inject 44 Units into the skin.    06/13/2021 at 0630   levothyroxine (SYNTHROID, LEVOTHROID) 125 MCG tablet TAKE ONE TABLET BY MOUTH EVERY MORNING BEFORE BREAKFAST]  0 06/13/2021 at 0630   Multiple Vitamin (MULTIVITAMIN WITH MINERALS) TABS tablet Take 1 tablet by mouth  daily.   Past Week   oxyCODONE (OXY IR/ROXICODONE) 5 MG immediate release tablet Take 1 tablet (5 mg total) by mouth every 6 (six) hours as needed for severe pain. 30 tablet 0 06/13/2021 at 0630   pantoprazole (PROTONIX) 40 MG tablet Take 1 tablet (40 mg total) by mouth daily. 20 tablet 0 Past Week   tamsulosin (FLOMAX) 0.4 MG CAPS capsule Take 1 capsule (0.4 mg total) by mouth daily. 30 capsule 1 Past Week   Vitamin D, Ergocalciferol, (DRISDOL) 50000 units CAPS capsule Take 50,000 Units by mouth every Friday.    Past Week   albuterol (VENTOLIN HFA) 108 (90 Base) MCG/ACT inhaler Inhale into the lungs.      insulin lispro (HUMALOG) 100 UNIT/ML KwikPen Inject into the skin.        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    Physical Exam  Gen: Alert, oriented. Appears stated age.  HEENT: [place]/AT. PERRLA. Lungs: CTA, no wheezes. CV: RR nl S1, S2. Abd: soft, benign, no masses. BS+ Ext: No edema. Pulses 2+    Planned procedures: Proceed with colonoscopy. The patient understands the  nature of the planned procedure, indications, risks, alternatives and potential complications including but not limited to bleeding, infection, perforation, damage to internal organs and possible oversedation/side effects from anesthesia. The patient agrees and gives consent to proceed.  Please refer to procedure notes for findings, recommendations and patient disposition/instructions.     [name] K. Alice Reichert, M.D. Gastroenterology 06/13/2021  9:40 AM

## 2021-06-13 NOTE — Anesthesia Preprocedure Evaluation (Signed)
Anesthesia Evaluation  Patient identified by MRN, date of birth, ID band Patient awake    Reviewed: Allergy & Precautions, NPO status , Patient's Chart, lab work & pertinent test results  History of Anesthesia Complications Negative for: history of anesthetic complications  Airway Mallampati: II  TM Distance: >3 FB Neck ROM: Full    Dental  (+) Poor Dentition   Pulmonary neg pulmonary ROS, neg sleep apnea, neg COPD,    breath sounds clear to auscultation- rhonchi (-) wheezing      Cardiovascular Exercise Tolerance: Good (-) hypertension(-) CAD, (-) Past MI, (-) Cardiac Stents and (-) CABG  Rhythm:Regular Rate:Normal - Systolic murmurs and - Diastolic murmurs    Neuro/Psych Seizures: in setting of meningitis, not on antiepileptics.  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 No date: Seizure ([place]) No date: Type 1 diabetes (HCC) No date: Vitamin D deficiency   Reproductive/Obstetrics                             Anesthesia Physical Anesthesia Plan  ASA: 2  Anesthesia Plan: General   Post-op Pain Management:    Induction: Intravenous  PONV Risk Score and Plan: 1 and Propofol infusion  Airway Management Planned: Natural Airway  Additional Equipment:   Intra-op Plan:   Post-operative Plan:   Informed Consent: I have reviewed the patients History and Physical, chart, labs and discussed the procedure including the risks, benefits and alternatives for the proposed anesthesia with the patient  or authorized representative who has indicated his/her understanding and acceptance.     Dental advisory given  Plan Discussed with: CRNA and Anesthesiologist  Anesthesia Plan Comments:         Anesthesia Quick Evaluation

## 2021-06-14 ENCOUNTER — Encounter: Payer: Self-pay | Admitting: Internal Medicine

## 2021-06-14 LAB — SURGICAL PATHOLOGY

## 2021-09-26 ENCOUNTER — Other Ambulatory Visit: Payer: BC Managed Care – PPO

## 2021-09-26 ENCOUNTER — Other Ambulatory Visit: Payer: Self-pay

## 2021-09-26 DIAGNOSIS — Z125 Encounter for screening for malignant neoplasm of prostate: Secondary | ICD-10-CM

## 2021-09-27 LAB — PSA: Prostate Specific Ag, Serum: 0.4 ng/mL (ref 0.0–4.0)

## 2021-09-28 ENCOUNTER — Other Ambulatory Visit: Payer: Self-pay

## 2021-10-03 ENCOUNTER — Ambulatory Visit: Payer: BC Managed Care – PPO | Admitting: Urology

## 2021-10-03 ENCOUNTER — Other Ambulatory Visit: Payer: Self-pay

## 2021-10-03 ENCOUNTER — Encounter: Payer: Self-pay | Admitting: Urology

## 2021-10-03 VITALS — BP 122/72 | HR 80 | Ht 68.0 in | Wt 150.0 lb

## 2021-10-03 DIAGNOSIS — Z125 Encounter for screening for malignant neoplasm of prostate: Secondary | ICD-10-CM

## 2021-10-03 DIAGNOSIS — N401 Enlarged prostate with lower urinary tract symptoms: Secondary | ICD-10-CM | POA: Diagnosis not present

## 2021-10-03 DIAGNOSIS — N138 Other obstructive and reflux uropathy: Secondary | ICD-10-CM

## 2021-10-03 LAB — BLADDER SCAN AMB NON-IMAGING

## 2021-10-03 NOTE — Progress Notes (Signed)
   10/03/2021 9:55 AM   Georgeann Oppenheim 05-25-1961 [id number]  Reason for visit: Follow up history of urinary retention, PSA screening  HPI: 60 year old male with diabetes who was admitted in September 2021 with altered mental status and hypoglycemia and bladder was mildly distended on CT and a catheter was placed at that time.  He passed a voiding trial and follow-up, and prostate measured 30 g on CT.  He was temporarily on Flomax, but discontinued this medication after a month with no worsening of his urinary symptoms.  He really denies any urinary complaints today, and PVR is normal at 66 mL.  Return precautions discussed including gross hematuria, difficulty urinating, or incontinence.  He also has a family history of lethal prostate cancer, and we have been following PSA.  PSA 09/26/2021 is stable at 0.4 from 1.0 last year.  We reviewed the AUA guidelines regarding screening.  RTC 1 year for PSA prior, PVR  Billey Co, MD  [facility] [address] [place], [place] [address] [phone]

## 2021-10-03 NOTE — Patient Instructions (Signed)
Prostate Cancer Screening ?Prostate cancer screening is testing that is done to check for the presence of prostate cancer in men. The prostate gland is a walnut-sized gland that is located below the bladder and in front of the rectum in males. The function of the prostate is to add fluid to semen during ejaculation. Prostate cancer is one of the most common types of cancer in men. ?Who should have prostate cancer screening? ?Screening recommendations vary based on age and other risk factors, as well as between the professional organizations who make the recommendations. ?In general, screening is recommended if: ?You are age 50 to 70 and have an average risk for prostate cancer. You should talk with your health care provider about your need for screening and how often screening should be done. Because most prostate cancers are slow growing and will not cause death, screening in this age group is generally reserved for men who have a 10- to 15-year life expectancy. ?You are younger than age 50, and you have these risk factors: ?Having a father, brother, or uncle who has been diagnosed with prostate cancer. The risk is higher if your family member's cancer occurred at an early age or if you have multiple family members with prostate cancer at an early age. ?Being a male who is Black or is of Caribbean or sub-Saharan African descent. ?In general, screening is not recommended if: ?You are younger than age 40. ?You are between the ages of 40 and 49 and you have no risk factors. ?You are 70 years of age or older. At this age, the risks that screening can cause are greater than the benefits that it may provide. ?If you are at high risk for prostate cancer, your health care provider may recommend that you have screenings more often or that you start screening at a younger age. ?How is screening for prostate cancer done? ?The recommended prostate cancer screening test is a blood test called the prostate-specific antigen (PSA)  test. PSA is a protein that is made in the prostate. As you age, your prostate naturally produces more PSA. Abnormally high PSA levels may be caused by: ?Prostate cancer. ?An enlarged prostate that is not caused by cancer (benign prostatic hyperplasia, or BPH). This condition is very common in older men. ?A prostate gland infection (prostatitis) or urinary tract infection. ?Certain medicines such as male hormones (like testosterone) or other medicines that raise testosterone levels. ?A rectal exam may be done as part of prostate cancer screening to help provide information about the size of your prostate gland. When a rectal exam is performed, it should be done after the PSA level is drawn to avoid any effect on the results. ?Depending on the PSA results, you may need more tests, such as: ?A physical exam to check the size of your prostate gland, if not done as part of screening. ?Blood and imaging tests. ?A procedure to remove tissue samples from your prostate gland for testing (biopsy). This is the only way to know for certain if you have prostate cancer. ?What are the benefits of prostate cancer screening? ?Screening can help to identify cancer at an early stage, before symptoms start and when the cancer can be treated more easily. ?There is a small chance that screening may lower your risk of dying from prostate cancer. The chance is small because prostate cancer is a slow-growing cancer, and most men with prostate cancer die from a different cause. ?What are the risks of prostate cancer screening? ?The main   risk of prostate cancer screening is diagnosing and treating prostate cancer that would never have caused any symptoms or problems. This is called overdiagnosisand overtreatment. PSA screening cannot tell you if your PSA is high due to cancer or a different cause. A prostate biopsy is the only procedure to diagnose prostate cancer. Even the results of a biopsy may not tell you if your cancer needs to be  treated. Slow-growing prostate cancer may not need any treatment other than monitoring, so diagnosing and treating it may cause unnecessary stress or other side effects. ?Questions to ask your health care provider ?When should I start prostate cancer screening? ?What is my risk for prostate cancer? ?How often do I need screening? ?What type of screening tests do I need? ?How do I get my test results? ?What do my results mean? ?Do I need treatment? ?Where to find more information ?The American Cancer Society: www.cancer.org ?American Urological Association: www.auanet.org ?Contact a health care provider if: ?You have difficulty urinating. ?You have pain when you urinate or ejaculate. ?You have blood in your urine or semen. ?You have pain in your back or in the area of your prostate. ?Summary ?Prostate cancer is a common type of cancer in men. The prostate gland is located below the bladder and in front of the rectum. This gland adds fluid to semen during ejaculation. ?Prostate cancer screening may identify cancer at an early stage, when the cancer can be treated more easily and is less likely to have spread to other areas of the body. ?The prostate-specific antigen (PSA) test is the recommended screening test for prostate cancer, but it has associated risks. ?Discuss the risks and benefits of prostate cancer screening with your health care provider. If you are age 70 or older, the risks that screening can cause are greater than the benefits that it may provide. ?This information is not intended to replace advice given to you by your health care provider. Make sure you discuss any questions you have with your health care provider. ?Document Revised: 04/16/2021 Document Reviewed: 04/16/2021 ?Elsevier Patient Education ? 2022 Elsevier Inc. ? ?

## 2022-07-29 ENCOUNTER — Encounter: Payer: Self-pay | Admitting: Urology

## 2022-09-30 ENCOUNTER — Encounter: Payer: Self-pay | Admitting: Urology

## 2022-09-30 ENCOUNTER — Other Ambulatory Visit: Payer: BC Managed Care – PPO

## 2022-10-02 ENCOUNTER — Ambulatory Visit: Payer: BC Managed Care – PPO | Admitting: Urology

## 2022-10-03 ENCOUNTER — Ambulatory Visit: Payer: BC Managed Care – PPO | Admitting: Urology

## 2022-10-04 ENCOUNTER — Encounter: Payer: Self-pay | Admitting: Urology

## 2023-11-25 ENCOUNTER — Ambulatory Visit
Admission: RE | Admit: 2023-11-25 | Discharge: 2023-11-25 | Disposition: A | Discharge status: Home / Self Care | Payer: BC Managed Care – PPO | Source: Ambulatory Visit | Attending: Family Medicine | Admitting: Family Medicine

## 2023-11-25 ENCOUNTER — Other Ambulatory Visit: Payer: Self-pay | Admitting: Family Medicine

## 2023-11-25 DIAGNOSIS — M5416 Radiculopathy, lumbar region: Secondary | ICD-10-CM
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
## 2023-12-01 ENCOUNTER — Ambulatory Visit: Payer: BC Managed Care – PPO | Admitting: Neurosurgery

## 2023-12-01 ENCOUNTER — Ambulatory Visit
Admission: RE | Admit: 2023-12-01 | Discharge: 2023-12-01 | Disposition: A | Discharge status: Home / Self Care | Payer: BC Managed Care – PPO | Source: Ambulatory Visit | Attending: Neurosurgery | Admitting: Neurosurgery

## 2023-12-01 ENCOUNTER — Ambulatory Visit
Admission: RE | Admit: 2023-12-01 | Discharge: 2023-12-01 | Disposition: A | Discharge status: Home / Self Care | Payer: BC Managed Care – PPO | Attending: Neurosurgery | Admitting: Neurosurgery

## 2023-12-01 VITALS — BP 140/62 | Ht 67.99 in | Wt 136.0 lb

## 2023-12-01 DIAGNOSIS — M47816 Spondylosis without myelopathy or radiculopathy, lumbar region: Secondary | ICD-10-CM | POA: Insufficient documentation

## 2023-12-01 DIAGNOSIS — S32040S Wedge compression fracture of fourth lumbar vertebra, sequela: Secondary | ICD-10-CM | POA: Insufficient documentation

## 2023-12-01 DIAGNOSIS — M5442 Lumbago with sciatica, left side: Secondary | ICD-10-CM | POA: Diagnosis not present

## 2023-12-01 DIAGNOSIS — X58XXXS Exposure to other specified factors, sequela: Secondary | ICD-10-CM | POA: Insufficient documentation

## 2023-12-01 DIAGNOSIS — M5441 Lumbago with sciatica, right side: Secondary | ICD-10-CM | POA: Diagnosis not present

## 2023-12-01 DIAGNOSIS — G8929 Other chronic pain: Secondary | ICD-10-CM | POA: Insufficient documentation

## 2023-12-01 NOTE — Patient Instructions (Signed)
LOCAL PHYSICAL THERAPY  [facility]  [address]  [place], [place] [address]  [phone]  [facility]  [address] [place], [place] [address]  [phone]  [facility] (2 locations)  [address]  [place], [place] [address]  [phone]          or  [address]  [place], [place] [address]  [phone]  [facility]  [address]  [place], [place] [address]  [phone]  **dry needling**  [facility]  [address]  [place], [place] [address]  [phone]  Fax: [phone]  ** Aquatic therapy[address] [place], [place] [address] [phone] **Aquatic therapy**  [facility]  [facility]  [address]  [place], [place] [address]  [phone]  [facility]  [address]  [place], [place] [address]  [phone]  [facility]  [address]   Janora Norlander  [place], [place] [address]  [phone]  [facility]  [address]  [place], [place] [address]  [phone]  **dry needling**   PELVIC FLOOR/SI JOINT  [facility]  Mariane Masters, PT  [email or web address]   [facility]  [address]  [place], [place] [address]  [phone]   [facility] [place]  [address], [place] [address]  [phone]   [facility], [place]  [facility]  Raymond Gurney, PT  [address]  [place], [place] [address] [phone]   Samara Deist  [facility]  [address]  [phone]   [facility]  [address]  [phone]   [facility]  Annamaria Boots [facility]  [address]  [place], [place] [address]  [phone]   **No Aetna or medicaid**  [facility]  [phone] [address] [place] [address]  [phone]  [facility]  [address]  [place], [place] [address]  [phone]  Dannial Monarch, DC  [address], [place] [address]  [phone]  [facility]  [address], [place] [address]  [phone]  [facility]  [address]  [place], [place] [address]  [phone]  [facility]  (3 locations)  [address]  [place], [place] [address]  [phone]  **dry needling**           or  [address] [place], [place] [address]  [phone]  **Additionally has Gloris [place], OT**           or  [address]  [place], [place] [address]  [phone]  **Pediatric therapy**  [facility]  [address]  [phone]  **dry needlingVerdie Drown [facility]  [address]  [place], [place] [address]  [phone]  [facility]   (Inside [address] [facility])  [address]  [place], [place] [address]  [phone]  **dry needling**  **MEDICAID or UNINSURED** [facility] dept. Of Physical Therapy [place], [place] [address] [phone]  [facility]  [address] [place], [place] [address]  [phone]   [facility]  [address]  [place], [place] [address]  [phone]   Doreatha Martin  [facility]  [address] [place], [place] [address]  [phone]   [facility]  [address]  [place], [place] [address]  [phone]   [facility]  [address] [place], [place] [address]  [phone]  [facility]  [address]  [place], [place] [address]  [phone]  AQUATIC  [facility]  [employer] [facility]  [facility]  [place]  *Residents only*   [facility] [employer]  *[facility]  Exercise class  [facility]  Exercise class  [facility]  [address]  [place], [place]   [phone]  [facility]  [address]  [place], [place] [address]  [phone]   [place], [place]  [facility]  [address]  [phone]   [facility]  [facility]  [address]  [phone]           or  [address]  [phone]   [facility] Arthritis Support [facility]   Provides education and support and practical information for coping with arthritis for arthritis sufferers and their families.   When: 12:15 - 1:30 p.m. the second Monday of each month, March through December  Info: Call Rehabilitation Services at [phone]
